# Patient Record
Sex: Male | Born: 1945 | Race: White | Hispanic: No | Marital: Married | Smoking: Former smoker
Health system: Southern US, Community
[De-identification: ages and names within clinical notes are randomized; demographics above are authoritative.]

## PROBLEM LIST (undated history)

## (undated) DIAGNOSIS — Z8719 Personal history of other diseases of the digestive system: Secondary | ICD-10-CM

## (undated) DIAGNOSIS — I10 Essential (primary) hypertension: Secondary | ICD-10-CM

## (undated) DIAGNOSIS — N289 Disorder of kidney and ureter, unspecified: Secondary | ICD-10-CM

## (undated) DIAGNOSIS — Z8711 Personal history of peptic ulcer disease: Secondary | ICD-10-CM

## (undated) HISTORY — PX: OTHER SURGICAL HISTORY: SHX169

---

## 1998-02-17 ENCOUNTER — Inpatient Hospital Stay (HOSPITAL_COMMUNITY): Admission: RE | Admit: 1998-02-17 | Discharge: 1998-02-22 | Payer: Self-pay | Admitting: *Deleted

## 1998-02-22 ENCOUNTER — Inpatient Hospital Stay (HOSPITAL_COMMUNITY)
Admission: RE | Admit: 1998-02-22 | Discharge: 1998-03-01 | Payer: Self-pay | Admitting: Physical Medicine and Rehabilitation

## 1998-03-21 ENCOUNTER — Encounter: Admission: RE | Admit: 1998-03-21 | Discharge: 1998-06-19 | Payer: Self-pay | Admitting: *Deleted

## 1998-08-25 ENCOUNTER — Ambulatory Visit (HOSPITAL_COMMUNITY): Admission: RE | Admit: 1998-08-25 | Discharge: 1998-08-25 | Payer: Self-pay | Admitting: *Deleted

## 1999-06-15 ENCOUNTER — Encounter: Payer: Self-pay | Admitting: Gastroenterology

## 1999-06-15 ENCOUNTER — Ambulatory Visit (HOSPITAL_COMMUNITY): Admission: RE | Admit: 1999-06-15 | Discharge: 1999-06-15 | Payer: Self-pay | Admitting: Gastroenterology

## 2000-08-19 ENCOUNTER — Encounter: Admission: RE | Admit: 2000-08-19 | Discharge: 2000-08-19 | Payer: Self-pay | Admitting: Neurology

## 2000-08-19 ENCOUNTER — Encounter: Payer: Self-pay | Admitting: Neurology

## 2000-10-28 ENCOUNTER — Ambulatory Visit (HOSPITAL_COMMUNITY): Admission: RE | Admit: 2000-10-28 | Discharge: 2000-10-28 | Payer: Self-pay | Admitting: Gastroenterology

## 2000-10-28 ENCOUNTER — Encounter (INDEPENDENT_AMBULATORY_CARE_PROVIDER_SITE_OTHER): Payer: Self-pay | Admitting: Specialist

## 2001-05-09 ENCOUNTER — Ambulatory Visit (HOSPITAL_BASED_OUTPATIENT_CLINIC_OR_DEPARTMENT_OTHER): Admission: RE | Admit: 2001-05-09 | Discharge: 2001-05-09 | Payer: Self-pay | Admitting: *Deleted

## 2002-07-30 ENCOUNTER — Ambulatory Visit (HOSPITAL_COMMUNITY): Admission: RE | Admit: 2002-07-30 | Discharge: 2002-07-30 | Payer: Self-pay | Admitting: Gastroenterology

## 2002-09-01 ENCOUNTER — Encounter: Admission: RE | Admit: 2002-09-01 | Discharge: 2002-09-01 | Payer: Self-pay | Admitting: General Surgery

## 2002-09-01 ENCOUNTER — Encounter: Payer: Self-pay | Admitting: General Surgery

## 2002-09-28 ENCOUNTER — Encounter: Payer: Self-pay | Admitting: Orthopedic Surgery

## 2002-10-01 ENCOUNTER — Inpatient Hospital Stay (HOSPITAL_COMMUNITY): Admission: RE | Admit: 2002-10-01 | Discharge: 2002-10-02 | Payer: Self-pay | Admitting: Orthopedic Surgery

## 2002-10-01 ENCOUNTER — Encounter: Payer: Self-pay | Admitting: Orthopedic Surgery

## 2005-12-13 ENCOUNTER — Encounter: Admission: RE | Admit: 2005-12-13 | Discharge: 2006-03-13 | Payer: Self-pay | Admitting: Orthopaedic Surgery

## 2007-03-15 ENCOUNTER — Emergency Department (HOSPITAL_COMMUNITY): Admission: EM | Admit: 2007-03-15 | Discharge: 2007-03-15 | Payer: Self-pay | Admitting: Emergency Medicine

## 2007-04-01 ENCOUNTER — Encounter: Admission: RE | Admit: 2007-04-01 | Discharge: 2007-04-01 | Payer: Self-pay | Admitting: Orthopedic Surgery

## 2007-11-06 ENCOUNTER — Inpatient Hospital Stay (HOSPITAL_COMMUNITY): Admission: RE | Admit: 2007-11-06 | Discharge: 2007-11-10 | Payer: Self-pay | Admitting: Orthopedic Surgery

## 2011-04-03 NOTE — Op Note (Signed)
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   PROCEDURES:  1. Removal of unicompartmental arthroplasty right knee.  2. Right total knee arthroplasty with DePuy components, #4 tibia, #4      femur, with a rotating platform, posterior stabilized at 10 mm poly      tray, with a 38 mm patella.   SURGEON:  Nadara Mustard, M.D.   ANESTHESIA:  General, plus femoral block.   ESTIMATED BLOOD LOSS:  Minimal.   ANTIBIOTICS:  2 g of Kefzol.   DRAINS:  None.   COMPLICATIONS:  None.   TOURNIQUET TIME:  300 mmHg at approximately to the thigh.   DISPOSITION:  To PACU in stable condition.   INDICATIONS FOR PROCEDURE:  The patient is a 65 year old gentleman who  is 5 years status post unicompartmental arthroplasty of the right knee.  The patient has developed progressive arthritis in the lateral joint  line as well as the patellofemoral joint and presents at this time for  surgical intervention.  Risks and benefits were discussed, including  infection, neurovascular injury, persistent pain, DVT, and need for  additional surgery.  The patient states he understands and wishes to  proceed at this time.   DESCRIPTION OF PROCEDURE:  The patient was brought to O.R. room #4 after  undergoing a femoral block.  He then underwent a general anesthetic.  After adequate levels of anesthesia were obtained, the patient's right  lower  extremity was prepped using DuraPrep and draped into a sterile  field.  An [name] was used cover all exposed skin.  A midline incision  was made, and this was incorporated into his medial parapatellar  incision from the unicompartmental arthroplasty.  This was carried down  with a medial retinacular incision.  The patella was everted.  The  unicompartmental arthroplasty was removed using an osteotome.  The  cement was also removed.  There was no damage or fracture through the  femoral condyle or the tibial plateau.  There was no loosening of the  implant.  The drill was then used to enter the femoral canal.  The IM  guide was used, set to 5 degrees of valgus, and this was set to take 11  mm off the distal femur.  The 11 mm cut was made, and this was sized for  a size 4. The cutting block for the size 4 was placed, and the chamfer  cuts were made for the size 4.  Attention was then focused on the tibia.  The  external alignment guide was used in the tibia, with neutral varus  and valgus alignment, with 0 degrees of posterior slope, and this was  pinned to take just additional bone just below the previous polyethylene  insert.  The tibial cut was made, and then this was sized for a size 4,  and the tray was placed, and the keel punches were made and the trial  component left in place.  Attention was then focused on the femur.  The  box-cutting tool was placed for the femur, and the box cuts were made.  A size 4 femur was placed with a size 4 tibia, and this was trialed with  tibial trays.  The #10 mm poly tray had full extension and was stable.  The trial components were removed.  Attention was focused on the  patella, and 10 mm was taken off the patella.  This was sized for a 38,  and the punch holes were made for the size 38, and the punch holes were  also made for the femoral component keels.  The knee was irrigated with  pulse lavage.  The posterior aspect of the joint was cleansed.  The   posterior capsule was injected with a total of 60 mL of 0.5% Marcaine  plain.  After irrigation with pulse lavage, the tibial and femoral  components were cemented in place.  The tibial tray was placed, and the  knee was kept in extension until the cement had hardened.  The patellar  component was inserted, and the clamp was left in place until the cement  had hardened.  All loose cement was removed.  After the cement had  hardened, the knee was placed through a full range of motion.  There was  good midline tracking of the patella.  The retinacular incision was  closed using #1 Vicryl.  The subcutaneous was closed using 2-0 Vicryl.  The skin was closed using approximating staples.  The wound was covered  with a Mepilex dressing and an ice pack.  The patient was extubated and  taken to the PACU in stable condition.      Nadara Mustard, MD  Electronically Signed     MVD/MEDQ  D:  11/06/2007  T:  11/07/2007  Job:  [id number]

## 2011-04-06 NOTE — Discharge Summary (Signed)
Arthur Cook, Arthur Cook NO.:  [id number]   MEDICAL RECORD NO.:  [id number]          PATIENT TYPE:  INP   LOCATION:  5037                         FACILITY:  [facility]   PHYSICIAN:  Nadara Mustard, MD     DATE OF BIRTH:  Jul 18, 1946   DATE OF ADMISSION:  11/06/2007  DATE OF DISCHARGE:  11/10/2007                               DISCHARGE SUMMARY   FINAL DIAGNOSIS:  Osteoarthritis right knee, status post  unicompartmental arthroplasty.   PROCEDURES:  1. Removal of unicompartmental arthroplasty, right knee.  2. Right total knee arthroplasty with DePuy #4 tibia, #4 femur, 10 mm      poly tray and 38 mm patella.   The patient was discharged to home in stable condition with home health  physical therapy.  Prescriptions for Vicodin, Tylox and Coumadin.  Plan  to follow up in office two weeks postoperatively.   HISTORY OF PRESENT ILLNESS:  Patient is 65 year old gentleman who is  status post unicompartmental arthroplasty.  The patient has developed  progressive arthritis in the other two compartments in his knee.  His  previous arthroplasty was stable, however, due to the progressive  arthritis and pain with activities of daily living, the patient wished  to proceed with revision to a total knee arthroplasty at this time.   HOSPITAL COURSE:  The patient's hospital course essentially  unremarkable.  He underwent a revision total knee arthroplasty on  November 06, 2007.  He received general anesthesia femoral and block,  Kefzol for infection prophylaxis.  Postoperatively, the patient received  Kefzol for infection prophylaxis for 24 hours and received Coumadin for  DVT prophylaxis.  The patient progressed well with physical therapy and  was felt to be safe for discharge to home and the patient was discharged  to home in stable condition on November 10, 2007, with follow-up in the  office in two weeks.      Nadara Mustard, MD  Electronically Signed     MVD/MEDQ  D:   12/25/2007  T:  12/26/2007  Job:  [id number]

## 2011-04-06 NOTE — Op Note (Signed)
NAME:  GEVORK, AYYAD                           ACCOUNT NO.:  [id number]   MEDICAL RECORD NO.:  [id number]                   PATIENT TYPE:  INP   LOCATION:  2873                                 FACILITY:  [facility]   PHYSICIAN:  Nadara Mustard, M.D.                DATE OF BIRTH:  1946/08/19   DATE OF PROCEDURE:  10/01/2002  DATE OF DISCHARGE:                                 OPERATIVE REPORT   PREOPERATIVE DIAGNOSIS:  Osteoarthritis right knee, medial joint line.   POSTOPERATIVE DIAGNOSIS:  Osteoarthritis right knee, medial joint line.   OPERATION PERFORMED:  1. Right knee unicompartmental arthroplasty with Osteonics components #10     tibial tray medium and a medium femoral component.  2. Partial patellectomy.   SURGEON:  Nadara Mustard, M.D.   ANESTHESIA:  General.   ESTIMATED BLOOD LOSS:  Minimal.   ANTIBIOTICS:  1 gm Kefzol.   TOURNIQUET TIME:  60 minutes at 350 mmHg.   DISPOSITION:  To PACU in stable condition.   INDICATIONS FOR PROCEDURE:  The patient is a 65 year old gentleman with  unicompartmental osteoarthritis of his right knee.  The patient has failed  conservative care  and presents at this time for unicompartmental  arthroplasty.  The risks and benefits were discussed including infection,  neurovascular injury, persistent pain and need for revision to a total knee.  The patient states he understands and wishes to proceed at this time.   DESCRIPTION OF PROCEDURE:  The patient was brought to operating room 4 and  underwent general anesthetic.  After an adequate level of anesthesia was  obtained, the patient's right lower extremity was prepped using DuraPrep and  draped into a sterile field.  [name] was used to cover all exposed skin and a  curtain drape was placed between the patient and the surgeon to maintain a  sterile field.  The leg was elevated and tourniquet inflated to 350 mmHg.  A  medial parapatellar incision was made and this was carried from the  superior  pole of the patella to the tibial tubercle.  This was carried down through  the retinaculum.  The fat pad was excised.  The retractors were placed and a  partial patellectomy was first performed off the medial patella.  The  external alignment guide was then placed for the tibia and this was set and  approximately 6 mm of tibia was removed.  This sized to be a medium in size.  Attention was focused on the femur.  Before the femur, the spacer blocks  were placed and the patient had equal flexion and extension gaps.  The  femoral block was placed.  This sized to be a medium with the external  alignment guide heading towards the femoral head.  This was pinned.  The  posterior femoral Chamfer was then made.  The Keel cut was made.  The router  guide was  then placed and the router cut was made for the femoral component.  Saline was used for irrigation during this procedure.  The cutting block was  removed and the remainder of the bone was removed.  The femoral and tibial  components were placed.  A tongue depressor blade could just be fit with the  knee in flexion and extension.  The patient had balanced flexion and  extension gaps.  The cement was mixed and the tibial and then femoral  component were then cemented in place.  This was left with the knee in about  30 degrees of flexion and this was maintained in stable condition until the  cement hardened.  The knee cement was removed.  The knee was placed through  a full range of motion.  The patient had good stable sensation and good  tracking.  The retinaculum was closed using a #1 Vicryl.  The subcu was  closed using 2-0 Vicryl and the skin was closed using Proximate staples.  The wound was covered with Adaptic orthopedic sponges and a Coban from the  foot to the thigh.  The patient was extubated and taken to PACU in stable  condition.  Total tourniquet time 60 minutes.                                                 Nadara Mustard, M.D.    MVD/MEDQ  D:  10/01/2002  T:  10/01/2002  Job:  [id number]

## 2011-04-06 NOTE — Procedures (Signed)
[place]. [facility]  Patient:    Arthur Cook, Arthur Cook                        MRN: [id number] Proc. Date: 10/28/00 Adm. Date:  [id number] Attending:  Nelda Marseille CC:         Pearla Dubonnet, M.D.   Procedure Report  PROCEDURE:  Esophagogastroduodenoscopy with biopsy balloon dilatation of the pylorus.  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  Consent was signed after risks, benefits, methods, and options thoroughly discussed on multiple occasions.  MEDICATIONS:  Demerol 100 mg, Versed 10 mg.  DESCRIPTION OF PROCEDURE:  The pediatric video endoscope was inserted by direct vision.  The esophagus was normal except for a small hiatal hernia. The scope was inserted into the stomach, and some old food was seen.  The stomach seemed to be dilated.  We advanced to the pylorus after realizing none of the food could be suctioned.  The pylorus looked unchanged with its proximal and distal fibrous strictures.  No active ulcers were seen.  The scope was able to be passed through this strictured area without much difficulty and into the distal duodenal bulb and around the C-loop to a normal second portion of the duodenum.  No active ulcers were seen.  The scope was then slowly withdrawn back to the stomach and retroflexed.  The cardia and fundus were normal.  The angularis was normal.  The food obscured part of the greater curve.  The scope was then reinserted into the second portion of the duodenum, and the 10 mm balloon, 5.5 in length, was carefully advanced into the duodenum, and the scope and the balloon carefully withdrawn to the proper position in the stomach.  This balloon was inflated but did not seem to do any dilations at maximum strength, so it was removed and the above-mentioned technique was used on both the 12 mm and then the 14 mm balloon.  He did seem to have some pain with blowing up both of these balloons,  and there was some minimal heme with each balloon.  After each balloon was held up for 45-60 seconds, the area was washed and watched.  There was no active bleeding.  The scope was withdrawn back to the stomach.  Two biopsies of the antrum and two in the fundus were obtained.  The residual water was suctioned.  Air was suctioned, and the scope was slowly withdrawn.  The patient tolerated the procedure well.  There was no obvious immediate complication.  ENDOSCOPIC DIAGNOSES: 1. Small hiatal hernia. 2. Old food in the dilated stomach. 3. Pyloric scarring essentially unchanged from previous EGDs. 4. Otherwise within normal limits EGD, status post gastric biopsy, rule out    Helicobacter therapy, balloon dilatation 10, 12, and 14 mm balloons, 5.5 in    length, with minimal pain and heme with the last two balloons.  PLAN:  Clear liquids only for 12 hours.  Will re-discuss surgical options. Avoid aspirin and nonsteroidals.  Continue pump inhibitors every day.  Call p.r.n., and otherwise follow up in one month to discuss any further workup plans or surgical options. DD:  10/28/00 TD:  10/28/00 Job: [id number]

## 2011-04-06 NOTE — Op Note (Signed)
TNAMESHAUGHN, THOMLEY                          ACCOUNT NO.:  [id number]   MEDICAL RECORD NO.:  [id number]                   PATIENT TYPE:  AMB   LOCATION:  ENDO                                 [facility]ACILITY[facility]  [facility]   PHYSICIAN:  Petra Kuba, M.D.                 DATE OF BIRTH:  Jun 20, 1946   DATE OF PROCEDURE:  DATE OF DISCHARGE:                                 OPERATIVE REPORT   PROCEDURE:  Esophagogastroduodenoscopy with pylorus balloon dilatation.   Consent was signed after risks, benefits, methods, and options were  thoroughly discussed once again with me prior to any sedation given and  multiple times in the past and by my nurse on the phone.   MEDICINES USED:   DESCRIPTION OF PROCEDURE:  The 160 video endoscope was inserted by direct  vision, the esophagus was normal. In the distal esophagus was a tiny hiatal  hernia. The scope passed into the stomach and lots of old food was seen,  some of which could be washed and suctioned but there were some free  particles which could not. The scope was then advanced through the normal  antrum to the pylorus which seemed to be scarred with linear ulcer around  it. We could not advance the scope through there. There seemed to be a tiny  opening engaged on multiple balloon dilatations in the past. We went ahead  and tried to as carefully as we could advance the 10 mm 5.5 cm balloon  through the pylorus and into the duodenum. We went ahead and inflated the 10  mm balloon and held it there fore 45 seconds. There was some heme but no  obvious other complication. We could not advance the scope through the  opening but the opening was bigger so we went ahead and removed then and  advanced the 12 mm, 5.5 cm balloon and after this was inflated and held for  45 seconds in seemingly the proper position, there was slightly more heme.  We were able to advance the scope through the opening. There was some  minimal bleeding, no obvious signs of  perforation. The C loop and the second  portion of the duodenum were normal. 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 There was no ulcer except for on the rim  of the pylorus on the stomach side which was thin and superficial. We went  ahead and advanced the 14 mm, 8.5 cm balloon. We inflated that for one  minute. After that was inflated, we again reinverted the scope. There was no  significant active bleeding, the area was washed and watched. The scope was  slowly withdrawn, some of the water was suctioned. On retroflexion only some  of the proximal stomach could be seen. The angularis was normal but the food  that was unable to be suctioned obscured the majority of the greater  curve  and proximal stomach. Air was suctioned, scope slowly withdrawn. Again a  good look at the esophagus was normal, scope removed. The patient tolerated  the procedure well. There was no obvious or immediate complication.   ENDOSCOPIC DIAGNOSIS:  1. Tiny hiatal hernia.  2. Old food in the proximal stomach.  3. Pyloric stenosis with a linear ulcer, unable to able to advance the 160     scope initially.  4. Status post dilatation using pyloric balloons to 14 mm. Able to advance     the scope after 12 mm dilation with only minimal heme and no obvious     complication.  5. C loop and second portion of the duodenum okay.   PLAN:  Clear liquids for 12 hours and rediscuss surgical options afterwards  and still may want to consider a surgical pyloroplasty versus Billroth  procedure. Would try to decrease Vioxx as I have warned him in the past and  increased  his pump inhibitors. He will call me if he has problems with the insurance.  Be happy to redilate p.r.n. and otherwise rule out in two months but call me  sooner p.r.n. particularly in a complication delay or in two to three weeks  if this had not been helpful.                                               Petra Kuba,  M.D.    MEM/MEDQ  D:  07/30/2002  T:  07/31/2002  Job:  [id number]   cc:   Barbette Or, M.D.

## 2011-04-06 NOTE — Op Note (Signed)
[place]. [facility]  Patient:    Arthur Cook, Arthur Cook                        MRN: [id number] Adm. Date:  [id number] Attending:  Maryanna Shape                           Operative Report  PREOPERATIVE DIAGNOSIS:  Right knee loose bodies and early osteoarthritis.  POSTOPERATIVE DIAGNOSES:  Posterior horn tear of medial meniscus; grade 4 chondromalacia, multiple chondral fragments; undersurface tear, lateral meniscus.  OPERATIVE PROCEDURE:  Arthroscopic exam, partial medial meniscectomy, debridement of multiple shed chondral fragments.  SURGEON:  Reynolds Bowl, M.D.  ANESTHESIA:  Local with standby.  DESCRIPTION OF PROCEDURE:  Patient was given local anesthetic and sedation. The right lower extremity was prepped and draped in the usual manner.  I established anteromedial and anterolateral portals and I examined the knee. In suprapatellar pouch area, the synovium appeared throughout to be irritated. There were multiple small chondral fragments that had been shed.  There were grade 4 changes of the medial femoral condyle and posteriorly, approximately half of the medial meniscus was degenerate and shredded and flapped.  The anterior cruciate was intact.  The notch was narrowing.  The lateral joint line had maybe grade 2 changes and there was an undersurface tear of the lateral meniscus that did not go all the way through the top and did not displace.  I used the arthroscope and small forceps to remove the posterior horn of the medial meniscus back to stable tissue, then I spent considerable time with the rotatory meniscotome and suction, drawing up multiple loose bodies throughout the joint and in the end, I just spent time using some of the remainder of the fluid.  The fluid flowed through the knee and I was pulsating the knee to remove any other unseen fragments.  Having done that, the instruments were removed, 15 cc of Marcaine with epinephrine injected in  the knee, portals approximated with 5-0 nylon, a bulky dressing applied.  INSTRUCTIONS TO PATIENT:  Take Vicodin for pain.  Do frequent quad sets and ankle pumps.  Use cold pack as tolerated.  Walk with a fairly straight knee for a few days to prevent swelling, full weightbear.  Come to the office as planned.  Call sooner with concerns. DD:  05/09/01 TD:  05/09/01 Job: [id number]

## 2011-08-24 LAB — BASIC METABOLIC PANEL
BUN: 10
Calcium: 8.7
Chloride: 105
Creatinine, Ser: 1.14
Creatinine, Ser: 1.15
GFR calc Af Amer: >60
GFR calc Af Amer: >60
GFR calc Af Amer: >60
GFR calc non Af Amer: 54 — ABNORMAL LOW
GFR calc non Af Amer: >60
Potassium: 4.2
Potassium: 4.2
Sodium: 136
Sodium: 137

## 2011-08-24 LAB — PROTIME-INR
INR: 1
INR: 1.5
INR: 1.9 — ABNORMAL HIGH
INR: 1.9 — ABNORMAL HIGH
Prothrombin Time: 13.3
Prothrombin Time: 22.6 — ABNORMAL HIGH

## 2011-08-24 LAB — CBC
Hemoglobin: 10.1 — ABNORMAL LOW
Hemoglobin: 11.4 — ABNORMAL LOW
MCV: 86.8
Platelets: 289
RBC: 3.42 — ABNORMAL LOW
RBC: 3.86 — ABNORMAL LOW
RBC: 4.41
RDW: 12.4
WBC: 11.8 — ABNORMAL HIGH
WBC: 12.2 — ABNORMAL HIGH
WBC: 14.3 — ABNORMAL HIGH

## 2011-08-27 LAB — BASIC METABOLIC PANEL
Calcium: 9.3
Chloride: 108
Creatinine, Ser: 0.89
GFR calc Af Amer: >60

## 2011-08-27 LAB — CBC
RBC: 4.98
WBC: 8.6

## 2011-08-27 LAB — PROTIME-INR
INR: 0.9
Prothrombin Time: 12.6

## 2017-10-26 ENCOUNTER — Inpatient Hospital Stay (HOSPITAL_COMMUNITY)
Admission: EM | Admit: 2017-10-26 | Discharge: 2017-10-30 | DRG: 381 | Disposition: A | Discharge status: Home / Self Care | Payer: Non-veteran care | Attending: Family Medicine | Admitting: Family Medicine

## 2017-10-26 ENCOUNTER — Emergency Department (HOSPITAL_COMMUNITY): Payer: Non-veteran care

## 2017-10-26 ENCOUNTER — Encounter (HOSPITAL_COMMUNITY): Payer: Self-pay | Admitting: Emergency Medicine

## 2017-10-26 DIAGNOSIS — K2980 Duodenitis without bleeding: Secondary | ICD-10-CM | POA: Diagnosis present

## 2017-10-26 DIAGNOSIS — R911 Solitary pulmonary nodule: Secondary | ICD-10-CM | POA: Diagnosis present

## 2017-10-26 DIAGNOSIS — K920 Hematemesis: Secondary | ICD-10-CM | POA: Diagnosis present

## 2017-10-26 DIAGNOSIS — I129 Hypertensive chronic kidney disease with stage 1 through stage 4 chronic kidney disease, or unspecified chronic kidney disease: Secondary | ICD-10-CM | POA: Diagnosis present

## 2017-10-26 DIAGNOSIS — Z8719 Personal history of other diseases of the digestive system: Secondary | ICD-10-CM

## 2017-10-26 DIAGNOSIS — Z79899 Other long term (current) drug therapy: Secondary | ICD-10-CM

## 2017-10-26 DIAGNOSIS — N2889 Other specified disorders of kidney and ureter: Secondary | ICD-10-CM | POA: Diagnosis present

## 2017-10-26 DIAGNOSIS — Z0189 Encounter for other specified special examinations: Secondary | ICD-10-CM

## 2017-10-26 DIAGNOSIS — K2210 Ulcer of esophagus without bleeding: Secondary | ICD-10-CM | POA: Diagnosis present

## 2017-10-26 DIAGNOSIS — Z978 Presence of other specified devices: Secondary | ICD-10-CM

## 2017-10-26 DIAGNOSIS — R319 Hematuria, unspecified: Secondary | ICD-10-CM | POA: Diagnosis not present

## 2017-10-26 DIAGNOSIS — I10 Essential (primary) hypertension: Secondary | ICD-10-CM | POA: Diagnosis present

## 2017-10-26 DIAGNOSIS — Z8546 Personal history of malignant neoplasm of prostate: Secondary | ICD-10-CM

## 2017-10-26 DIAGNOSIS — Z8711 Personal history of peptic ulcer disease: Secondary | ICD-10-CM

## 2017-10-26 DIAGNOSIS — Z87891 Personal history of nicotine dependence: Secondary | ICD-10-CM

## 2017-10-26 DIAGNOSIS — G43909 Migraine, unspecified, not intractable, without status migrainosus: Secondary | ICD-10-CM | POA: Diagnosis present

## 2017-10-26 DIAGNOSIS — N289 Disorder of kidney and ureter, unspecified: Secondary | ICD-10-CM

## 2017-10-26 DIAGNOSIS — Z791 Long term (current) use of non-steroidal anti-inflammatories (NSAID): Secondary | ICD-10-CM

## 2017-10-26 DIAGNOSIS — K311 Adult hypertrophic pyloric stenosis: Secondary | ICD-10-CM | POA: Diagnosis not present

## 2017-10-26 DIAGNOSIS — N181 Chronic kidney disease, stage 1: Secondary | ICD-10-CM | POA: Diagnosis present

## 2017-10-26 DIAGNOSIS — G4700 Insomnia, unspecified: Secondary | ICD-10-CM | POA: Diagnosis present

## 2017-10-26 DIAGNOSIS — R112 Nausea with vomiting, unspecified: Secondary | ICD-10-CM | POA: Diagnosis present

## 2017-10-26 HISTORY — DX: Personal history of peptic ulcer disease: Z87.11

## 2017-10-26 HISTORY — DX: Essential (primary) hypertension: I10

## 2017-10-26 HISTORY — DX: Disorder of kidney and ureter, unspecified: N28.9

## 2017-10-26 HISTORY — DX: Personal history of other diseases of the digestive system: Z87.19

## 2017-10-26 LAB — HEPATIC FUNCTION PANEL
ALT: 25 U/L (ref 17–63)
AST: 23 U/L (ref 15–41)
Albumin: 4.6 g/dL (ref 3.5–5.0)
Alkaline Phosphatase: 52 U/L (ref 38–126)
Total Bilirubin: 0.7 mg/dL (ref 0.3–1.2)
Total Protein: 8.1 g/dL (ref 6.5–8.1)

## 2017-10-26 LAB — CBC
HEMATOCRIT: 46.6 % (ref 39.0–52.0)
HEMOGLOBIN: 15.5 g/dL (ref 13.0–17.0)
MCH: 28.9 pg (ref 26.0–34.0)
MCHC: 33.3 g/dL (ref 30.0–36.0)
MCV: 86.8 fL (ref 78.0–100.0)
Platelets: 338 10*3/uL (ref 150–400)
RBC: 5.37 MIL/uL (ref 4.22–5.81)
RDW: 13.2 % (ref 11.5–15.5)
WBC: 15.4 10*3/uL — AB (ref 4.0–10.5)

## 2017-10-26 LAB — BASIC METABOLIC PANEL
ANION GAP: 11 (ref 5–15)
BUN: 17 mg/dL (ref 6–20)
CALCIUM: 10.8 mg/dL — AB (ref 8.9–10.3)
CO2: 32 mmol/L (ref 22–32)
Chloride: 98 mmol/L — ABNORMAL LOW (ref 101–111)
Creatinine, Ser: 1.33 mg/dL — ABNORMAL HIGH (ref 0.61–1.24)
GFR, EST AFRICAN AMERICAN: 60 mL/min — AB (ref >60)
GFR, EST NON AFRICAN AMERICAN: 51 mL/min — AB (ref >60)
Glucose, Bld: 146 mg/dL — ABNORMAL HIGH (ref 65–99)
POTASSIUM: 3.7 mmol/L (ref 3.5–5.1)
SODIUM: 141 mmol/L (ref 135–145)

## 2017-10-26 LAB — I-STAT TROPONIN, ED: TROPONIN I, POC: 0 ng/mL (ref 0.00–0.08)

## 2017-10-26 LAB — LIPASE, BLOOD: LIPASE: 48 U/L (ref 11–51)

## 2017-10-26 MED ORDER — LORAZEPAM 2 MG/ML IJ SOLN
0.5000 mg | Freq: Once | INTRAMUSCULAR | Status: AC
  Administered 2017-10-26: 0.5 mg via INTRAVENOUS
  Filled 2017-10-26: qty 1

## 2017-10-26 MED ORDER — SUCRALFATE 1 G PO TABS
1.0000 g | ORAL_TABLET | Freq: Once | ORAL | Status: DC
  Filled 2017-10-26: qty 1

## 2017-10-26 MED ORDER — GI COCKTAIL ~~LOC~~
30.0000 mL | Freq: Once | ORAL | Status: AC
  Administered 2017-10-26: 30 mL via ORAL
  Filled 2017-10-26: qty 30

## 2017-10-26 MED ORDER — DIPHENHYDRAMINE HCL 50 MG/ML IJ SOLN
12.5000 mg | Freq: Once | INTRAMUSCULAR | Status: AC
  Administered 2017-10-26: 12.5 mg via INTRAVENOUS
  Filled 2017-10-26: qty 1

## 2017-10-26 MED ORDER — SODIUM CHLORIDE 0.9 % IV SOLN
INTRAVENOUS | Status: DC
  Administered 2017-10-26 (×2): via INTRAVENOUS

## 2017-10-26 MED ORDER — IOPAMIDOL (ISOVUE-300) INJECTION 61%
INTRAVENOUS | Status: AC
  Administered 2017-10-26: 100 mL
  Filled 2017-10-26: qty 100

## 2017-10-26 MED ORDER — ONDANSETRON HCL 4 MG/2ML IJ SOLN
4.0000 mg | Freq: Once | INTRAMUSCULAR | Status: AC
  Administered 2017-10-26: 4 mg via INTRAVENOUS
  Filled 2017-10-26: qty 2

## 2017-10-26 MED ORDER — MORPHINE SULFATE (PF) 4 MG/ML IV SOLN
4.0000 mg | Freq: Once | INTRAVENOUS | Status: AC
  Administered 2017-10-26: 4 mg via INTRAVENOUS
  Filled 2017-10-26: qty 1

## 2017-10-26 MED ORDER — METOCLOPRAMIDE HCL 5 MG/ML IJ SOLN
5.0000 mg | Freq: Once | INTRAMUSCULAR | Status: AC
  Administered 2017-10-26: 5 mg via INTRAVENOUS
  Filled 2017-10-26: qty 2

## 2017-10-26 MED ORDER — SODIUM CHLORIDE 0.9 % IV BOLUS (SEPSIS)
1000.0000 mL | Freq: Once | INTRAVENOUS | Status: AC
  Administered 2017-10-26: 1000 mL via INTRAVENOUS

## 2017-10-26 MED ORDER — PANTOPRAZOLE SODIUM 40 MG IV SOLR
40.0000 mg | Freq: Once | INTRAVENOUS | Status: AC
  Administered 2017-10-26: 40 mg via INTRAVENOUS
  Filled 2017-10-26: qty 40

## 2017-10-26 NOTE — Consult Note (Signed)
Reason for Consult:gastric outlet obstruction Referring Physician: dr Arthur Cook is an 71 y.o. male.  HPI: 71 year old gentleman with known history of peptic ulcer disease comes to the emergency room because of persistent upper abdominal pain and vomiting since yesterday.  His past medical history includes hypertension, BPH, prostate cancer.  He gets the majority of his care at the [facility].  He states that in the past Dr. Watt Cook has done upper endoscopy and dilated his stomach.  The last upper endoscopy was many years ago.  He has chronic migraines.  He states that the [facility] will only give him 10 injections for migraines per month.  When he has run out his only other option is to take NSAIDs such as BC powders for his headaches.  He states that he suffers about 25 migraines per month.  He takes reflux medication as needed.  He reports chronic abdominal bloating but denies early satiety.  He denies weight loss.  His last bowel movement was today.  He denies fevers or chills.  He denies hematemesis.  He denies prior abdominal surgery.  He states that he had bloody clots in his urine last week.  They state that he has a history of prostate cancer but never received treatment it was just monitored.  He reports that he urinates multiple times per night.  They are aware of a right renal mass but was told it was benign.  All his imaging was done at the [facility] in [place]  He used to smoke but stopped smoking several years ago.  Past Medical History:  Diagnosis Date  . History of stomach ulcers   . Hypertension   . Renal disorder    frequent UTIs    Past Surgical History:  Procedure Laterality Date  . knee operations      No family history on file.  Social History:  reports that he has quit smoking. His smoking use included cigarettes. he has never used smokeless tobacco. He reports that he does not drink alcohol or use drugs.  Allergies: No Known Allergies  Medications: I have reviewed the  patient's current medications.  Results for orders placed or performed during the hospital encounter of 10/26/17 (from the past 48 hour(s))  Basic metabolic panel     Status: Abnormal   Collection Time: 10/26/17  2:50 PM  Result Value Ref Range   Sodium 141 135 - 145 mmol/L   Potassium 3.7 3.5 - 5.1 mmol/L   Chloride 98 (L) 101 - 111 mmol/L   CO2 32 22 - 32 mmol/L   Glucose, Bld 146 (H) 65 - 99 mg/dL   BUN 17 6 - 20 mg/dL   Creatinine, Ser 1.33 (H) 0.61 - 1.24 mg/dL   Calcium 10.8 (H) 8.9 - 10.3 mg/dL   GFR calc non Af Amer 51 (L) >60 mL/min   GFR calc Af Amer 60 (L) >60 mL/min    Comment: (NOTE) The eGFR has been calculated using the CKD EPI equation. This calculation has not been validated in all clinical situations. eGFR's persistently <60 mL/min signify possible Chronic Kidney Disease.    Anion gap 11 5 - 15  CBC     Status: Abnormal   Collection Time: 10/26/17  2:50 PM  Result Value Ref Range   WBC 15.4 (H) 4.0 - 10.5 K/uL   RBC 5.37 4.22 - 5.81 MIL/uL   Hemoglobin 15.5 13.0 - 17.0 g/dL   HCT 46.6 39.0 - 52.0 %   MCV 86.8 78.0 -  100.0 fL   MCH 28.9 26.0 - 34.0 pg   MCHC 33.3 30.0 - 36.0 g/dL   RDW 13.2 11.5 - 15.5 %   Platelets 338 150 - 400 K/uL  I-stat troponin, ED     Status: None   Collection Time: 10/26/17  3:11 PM  Result Value Ref Range   Troponin i, poc 0.00 0.00 - 0.08 ng/mL   Comment 3            Comment: Due to the release kinetics of cTnI, a negative result within the first hours of the onset of symptoms does not rule out myocardial infarction with certainty. If myocardial infarction is still suspected, repeat the test at appropriate intervals.   Lipase, blood     Status: None   Collection Time: 10/26/17  4:43 PM  Result Value Ref Range   Lipase 48 11 - 51 U/L  Hepatic function panel     Status: Abnormal   Collection Time: 10/26/17  4:43 PM  Result Value Ref Range   Total Protein 8.1 6.5 - 8.1 g/dL   Albumin 4.6 3.5 - 5.0 g/dL   AST 23 15 - 41  U/L   ALT 25 17 - 63 U/L   Alkaline Phosphatase 52 38 - 126 U/L   Total Bilirubin 0.7 0.3 - 1.2 mg/dL   Bilirubin, Direct <0.1 (L) 0.1 - 0.5 mg/dL   Indirect Bilirubin NOT CALCULATED 0.3 - 0.9 mg/dL    Dg Chest 2 View  Result Date: 10/26/2017 CLINICAL DATA:  Chest pain. EXAM: CHEST  2 VIEW COMPARISON:  Two-view chest x-ray 11/06/2007 FINDINGS: The heart size and mediastinal contours are within normal limits. Both lungs are clear. The visualized skeletal structures are unremarkable. IMPRESSION: Negative two view chest x-ray Electronically Signed   By: San Morelle M.D.   On: 10/26/2017 15:22   Ct Abdomen Pelvis W Contrast  Result Date: 10/26/2017 CLINICAL DATA:  Dark colored vomiting.  Hematuria 2 weeks ago. EXAM: CT ABDOMEN AND PELVIS WITH CONTRAST TECHNIQUE: Multidetector CT imaging of the abdomen and pelvis was performed using the standard protocol following bolus administration of intravenous contrast. CONTRAST:  125m ISOVUE-300 IOPAMIDOL (ISOVUE-300) INJECTION 61% COMPARISON:  None. FINDINGS: Lower chest: There is a nodule in the right lung on series 4, image 2 adjacent to the fissure measuring 6.7 mm. No other acute abnormalities are identified in the lung bases. Hepatobiliary: Hepatic steatosis. The liver, portal vein, and gallbladder are otherwise normal. Pancreas: Focal rounded fat in the pancreas on series 3, image 47 of no significance. The pancreas is otherwise normal. Spleen: Normal in size without focal abnormality. Adrenals/Urinary Tract: There is a 12 mm nodule in the left adrenal gland on series 3, image 30, too small to characterize. The right adrenal gland is normal. There is a 3.5 cm mass off the upper pole the right kidney with an attenuation of 24 Hounsfield units. No other renal masses. No hydronephrosis or perinephric stranding. No ureterectasis or ureteral stone. The bladder is normal. Stomach/Bowel: The stomach is markedly distended with fluid and debris. The proximal  duodenum is normal in caliber. The small bowel is otherwise normal. Colonic diverticulosis is seen without focal diverticulitis. The visualized appendix is normal. Vascular/Lymphatic: Atherosclerotic changes are seen in the nonaneurysmal aorta, iliac vessels common femoral vessels. No adenopathy. Reproductive: Prostate calcifications are identified. The prostate is otherwise normal. Other: There is mild increased attenuation in the fat of the gastrohepatic ligament along the lesser curvature of the stomach. No free air  or free fluid. Musculoskeletal: No acute or significant osseous findings. IMPRESSION: 1. There is marked distension of the stomach. There may be mild thickening in the region of the distal antrum and pylorus based on coronal images. The findings are most consistent with gastric outlet obstruction of unknown etiology. The mild increased attenuation in the fat of the gastrohepatic ligament could be due to inflammation or venous congestion given the gastric distention. 2. There is a mass in the upper pole the right kidney which is not definitely cystic based on today's study. I suspect this is a complicated cyst but a solid mass is not excluded. Recommend ultrasound for further evaluation. If the mass is not definitively cystic on ultrasound, recommend MRI. 3. 6.7 mm nodule in the right lung base. Non-contrast chest CT at 6-12 months is recommended. If the nodule is stable at time of repeat CT, then future CT at 18-24 months (from today's scan) is considered optional for low-risk patients, but is recommended for high-risk patients. This recommendation follows the consensus statement: Guidelines for Management of Incidental Pulmonary Nodules Detected on CT Images: From the Fleischner Society 2017; Radiology 2017; 284:228-243. 4. 12 mm nodule in the left adrenal gland is too small to characterize. If the patient does not have a known malignancy, recommend a 12 month follow-up CT scan to ensure stability.  5. Atherosclerotic change in the abdominal aorta. Electronically Signed   By: Dorise Bullion III M.D   On: 10/26/2017 21:45    Review of Systems  Constitutional: Negative for chills, fever and weight loss.  HENT: Negative for nosebleeds.   Eyes: Negative for blurred vision.  Respiratory: Negative for shortness of breath.   Cardiovascular: Negative for chest pain, palpitations, orthopnea and PND.       Denies DOE  Gastrointestinal: Positive for abdominal pain, nausea and vomiting. Negative for blood in stool and constipation.  Genitourinary: Positive for hematuria. Negative for dysuria.  Musculoskeletal: Negative.   Skin: Negative for itching and rash.  Neurological: Negative for dizziness, focal weakness, seizures, loss of consciousness and headaches.       Denies TIAs, amaurosis fugax  Endo/Heme/Allergies: Does not bruise/bleed easily.  Psychiatric/Behavioral: The patient is not nervous/anxious.    Blood pressure (!) 202/120, pulse (!) 111, temperature 98 F (36.7 C), temperature source Oral, resp. rate (!) 27, height '5\' 10"'$  (1.778 m), weight 108.9 kg (240 lb), SpO2 91 %. Physical Exam  Vitals reviewed. Constitutional: He is oriented to person, place, and time. He appears well-developed and well-nourished. No distress.  HENT:  Head: Normocephalic and atraumatic.  Right Ear: External ear normal.  Left Ear: External ear normal.  Eyes: Conjunctivae are normal. No scleral icterus.  Neck: Normal range of motion. Neck supple. No tracheal deviation present. No thyromegaly present.  Cardiovascular: Normal rate and normal heart sounds.  Respiratory: Effort normal and breath sounds normal. No stridor. No respiratory distress. He has no wheezes.  GI: Soft. There is no tenderness. There is no rebound and no guarding.  Mild distension  Musculoskeletal: He exhibits no edema or tenderness.  Lymphadenopathy:    He has no cervical adenopathy.  Neurological: He is alert and oriented to person,  place, and time. He exhibits normal muscle tone.  Skin: Skin is warm and dry. No rash noted. He is not diaphoretic. No erythema. No pallor.  Psychiatric: He has a normal mood and affect. His behavior is normal. Judgment and thought content normal.    Assessment/Plan: Gastric outlet obstruction Migraines Hypertension BPH Recent hematuria  Chronic NSAID use Right renal mass Right lung nodule  On placement of the nasogastric tube over 2 L of gastric contents was evacuated.  He felt better. Admit to medicine service given multiple comorbidities Needs [facility] gastroenterology consult for upper endoscopy Continue bowel rest, NG tube decompression More than likely his gastric outlet obstruction is due to peptic ulcer disease but need to rule out malignancy Start IV Protonix  With respect to his right renal mass, it sounds like this is a known issue but nonetheless would recommend ultrasound  Defer hematuria workup to primary team may need to contact his [facility] team to get his records  Surgical plan (if any) depends on results of upper endoscopy  Discussed treatment plan with patient and his family.  Tried to answer as many questions as I could  Arthur Ruff. Redmond Pulling, MD, FACS General, Bariatric, & Minimally Invasive Surgery [facility]   Greer Pickerel 10/26/2017, 11:17 PM

## 2017-10-26 NOTE — ED Notes (Signed)
EDP at bedside  

## 2017-10-26 NOTE — ED Notes (Signed)
Surgery MD at bedside.

## 2017-10-26 NOTE — ED Notes (Signed)
Awaiting CT at this time. Called CT and pt is next in line.

## 2017-10-26 NOTE — ED Notes (Signed)
Patient transported to CT 

## 2017-10-26 NOTE — ED Provider Notes (Addendum)
[facility] EMERGENCY DEPARTMENT Provider Note   CSN: [id number] Arrival date & time: 10/26/17  1418     History   Chief Complaint Chief Complaint  Patient presents with  . Emesis  . Chest Pain    HPI Arthur Cook is a 71 y.o. male.  71 year old male presents with epigastric pain times several days.  Does have history of gastric ulcers and also suffers from chronic migraines.  His current headache is similar to his migraines in the past.  He has had some nausea with nonbilious emesis.  No fever or chills.  No neck pain.  Has been treating his headaches with NSAIDs which she says seems to irritate things.  Has had similar symptoms in the past relieved with antacids and he tried Pepto-Bismol without relief.  Denies any anginal symptoms.  No black stools.  Denies any severe weakness with standing.      Past Medical History:  Diagnosis Date  . History of stomach ulcers   . Hypertension   . Renal disorder    frequent UTIs    There are no active problems to display for this patient.   Past Surgical History:  Procedure Laterality Date  . knee operations         Home Medications    Prior to Admission medications   Not on File    Family History No family history on file.  Social History Social History   Tobacco Use  . Smoking status: Former Smoker    Types: Cigarettes  . Smokeless tobacco: Never Used  Substance Use Topics  . Alcohol use: No    Frequency: Never  . Drug use: No     Allergies   Patient has no allergy information on record.   Review of Systems Review of Systems  All other systems reviewed and are negative.    Physical Exam Updated Vital Signs BP (!) 197/125 (BP Location: Right Arm) Comment: RN notified  Pulse 66   Temp 98 F (36.7 C) (Oral)   Resp 17   Ht 1.778 m (5\' 10" )   Wt 108.9 kg (240 lb)   SpO2 96%   BMI 34.44 kg/m   Physical Exam  Constitutional: He is oriented to person, place, and time. He  appears well-developed and well-nourished.  Non-toxic appearance. No distress.  HENT:  Head: Normocephalic and atraumatic.  Eyes: Conjunctivae, EOM and lids are normal. Pupils are equal, round, and reactive to light.  Neck: Normal range of motion. Neck supple. No tracheal deviation present. No thyroid mass present.  Cardiovascular: Normal rate, regular rhythm and normal heart sounds. Exam reveals no gallop.  No murmur heard. Pulmonary/Chest: Effort normal and breath sounds normal. No stridor. No respiratory distress. He has no decreased breath sounds. He has no wheezes. He has no rhonchi. He has no rales.  Abdominal: Soft. Normal appearance and bowel sounds are normal. He exhibits no distension. There is no tenderness. There is no rebound and no CVA tenderness.  Musculoskeletal: Normal range of motion. He exhibits no edema or tenderness.  Neurological: He is alert and oriented to person, place, and time. He has normal strength. No cranial nerve deficit or sensory deficit. GCS eye subscore is 4. GCS verbal subscore is 5. GCS motor subscore is 6.  Skin: Skin is warm and dry. No abrasion and no rash noted.  Psychiatric: He has a normal mood and affect. His speech is normal and behavior is normal.  Nursing note and vitals reviewed.  ED Treatments / Results  Labs (all labs ordered are listed, but only abnormal results are displayed) Labs Reviewed  CBC - Abnormal; Notable for the following components:      Result Value   WBC 15.4 (*)    All other components within normal limits  BASIC METABOLIC PANEL  LIPASE, BLOOD  HEPATIC FUNCTION PANEL  I-STAT TROPONIN, ED    EKG  EKG Interpretation  Date/Time:  Saturday October 26 2017 14:26:44 EST Ventricular Rate:  92 PR Interval:  150 QRS Duration: 84 QT Interval:  360 QTC Calculation: 445 R Axis:   -9 Text Interpretation:  Sinus rhythm with marked sinus arrhythmia Inferior Q waves (noted on prior) Abnormal ECG Confirmed by Rolland PorterJames, Mark  [phone]([id number]) on 10/26/2017 2:33:07 PM Also confirmed by Rolland PorterJames, Mark ([id number]), editor Madalyn RobEverhart, Marilyn [phone])  on 10/26/2017 2:55:30 PM Also confirmed by Lorre NickAllen, Kalyani Maeda ([id number])  on 10/26/2017 4:31:03 PM       Radiology Dg Chest 2 View  Result Date: 10/26/2017 CLINICAL DATA:  Chest pain. EXAM: CHEST  2 VIEW COMPARISON:  Two-view chest x-ray 11/06/2007 FINDINGS: The heart size and mediastinal contours are within normal limits. Both lungs are clear. The visualized skeletal structures are unremarkable. IMPRESSION: Negative two view chest x-ray Electronically Signed   By: Marin Robertshristopher  Mattern M.D.   On: 10/26/2017 15:22    Procedures Procedures (including critical care time)  Medications Ordered in ED Medications  sodium chloride 0.9 % bolus 1,000 mL (not administered)  0.9 %  sodium chloride infusion (not administered)  metoCLOPramide (REGLAN) injection 5 mg (not administered)  diphenhydrAMINE (BENADRYL) injection 12.5 mg (not administered)  pantoprazole (PROTONIX) injection 40 mg (not administered)  gi cocktail (Maalox,Lidocaine,Donnatal) (not administered)  sucralfate (CARAFATE) tablet 1 g (not administered)     Initial Impression / Assessment and Plan / ED Course  I have reviewed the triage vital signs and the nursing notes.  Pertinent labs & imaging results that were available during my care of the patient were reviewed by me and considered in my medical decision making (see chart for details).     Patient medicated here for pain as well as given IV fluids here.  And still complains of nausea and emesis as well as abdominal discomfort.  Patient had abdominal CT which shows evidence of gastric outlet obstruction.  Spoke with Dr. Andrey CampanileWilson from general surgery who will come and see the patient.  NG tube to be placed by nursing.   11:23 PM Patient seen by general surgery who requests admission by the medicine team with a GI consult  Final Clinical Impressions(s) / ED Diagnoses   Final  diagnoses:  None    ED Discharge Orders    None       Lorre NickAllen, Berdena Cisek, MD 10/26/17 2221    Lorre NickAllen, Aricka Goldberger, MD 10/26/17 2323

## 2017-10-26 NOTE — ED Triage Notes (Addendum)
Pt c/o "burning in upper chest-- with vomiting everything that I drink or eat. " has been taking BC powders for 3 days, states "I am not supposed to take them because of ulcers-- but I have had a migraine" states vomit is dark colored,  Pt also states he had blood in urine 2 weeks ago 3 episodes, none now,

## 2017-10-27 ENCOUNTER — Encounter (HOSPITAL_COMMUNITY): Payer: Self-pay | Admitting: Family Medicine

## 2017-10-27 ENCOUNTER — Inpatient Hospital Stay (HOSPITAL_COMMUNITY): Payer: Non-veteran care

## 2017-10-27 ENCOUNTER — Other Ambulatory Visit: Payer: Self-pay

## 2017-10-27 DIAGNOSIS — K2980 Duodenitis without bleeding: Secondary | ICD-10-CM | POA: Diagnosis present

## 2017-10-27 DIAGNOSIS — Z79899 Other long term (current) drug therapy: Secondary | ICD-10-CM | POA: Diagnosis not present

## 2017-10-27 DIAGNOSIS — R112 Nausea with vomiting, unspecified: Secondary | ICD-10-CM | POA: Diagnosis present

## 2017-10-27 DIAGNOSIS — I10 Essential (primary) hypertension: Secondary | ICD-10-CM | POA: Diagnosis present

## 2017-10-27 DIAGNOSIS — Z87891 Personal history of nicotine dependence: Secondary | ICD-10-CM | POA: Diagnosis not present

## 2017-10-27 DIAGNOSIS — K311 Adult hypertrophic pyloric stenosis: Secondary | ICD-10-CM | POA: Diagnosis present

## 2017-10-27 DIAGNOSIS — G4700 Insomnia, unspecified: Secondary | ICD-10-CM | POA: Insufficient documentation

## 2017-10-27 DIAGNOSIS — Z8711 Personal history of peptic ulcer disease: Secondary | ICD-10-CM | POA: Diagnosis not present

## 2017-10-27 DIAGNOSIS — N289 Disorder of kidney and ureter, unspecified: Secondary | ICD-10-CM

## 2017-10-27 DIAGNOSIS — I129 Hypertensive chronic kidney disease with stage 1 through stage 4 chronic kidney disease, or unspecified chronic kidney disease: Secondary | ICD-10-CM | POA: Diagnosis present

## 2017-10-27 DIAGNOSIS — Z791 Long term (current) use of non-steroidal anti-inflammatories (NSAID): Secondary | ICD-10-CM | POA: Diagnosis not present

## 2017-10-27 DIAGNOSIS — K920 Hematemesis: Secondary | ICD-10-CM | POA: Diagnosis present

## 2017-10-27 DIAGNOSIS — R911 Solitary pulmonary nodule: Secondary | ICD-10-CM | POA: Diagnosis present

## 2017-10-27 DIAGNOSIS — K2210 Ulcer of esophagus without bleeding: Secondary | ICD-10-CM | POA: Diagnosis present

## 2017-10-27 DIAGNOSIS — R319 Hematuria, unspecified: Secondary | ICD-10-CM | POA: Diagnosis not present

## 2017-10-27 DIAGNOSIS — G43909 Migraine, unspecified, not intractable, without status migrainosus: Secondary | ICD-10-CM | POA: Diagnosis present

## 2017-10-27 DIAGNOSIS — N2889 Other specified disorders of kidney and ureter: Secondary | ICD-10-CM | POA: Diagnosis present

## 2017-10-27 DIAGNOSIS — Z8719 Personal history of other diseases of the digestive system: Secondary | ICD-10-CM | POA: Diagnosis not present

## 2017-10-27 DIAGNOSIS — N181 Chronic kidney disease, stage 1: Secondary | ICD-10-CM | POA: Diagnosis present

## 2017-10-27 DIAGNOSIS — Z8546 Personal history of malignant neoplasm of prostate: Secondary | ICD-10-CM | POA: Diagnosis not present

## 2017-10-27 LAB — BASIC METABOLIC PANEL
Anion gap: 10 (ref 5–15)
BUN: 15 mg/dL (ref 6–20)
CALCIUM: 9.7 mg/dL (ref 8.9–10.3)
CHLORIDE: 102 mmol/L (ref 101–111)
CO2: 29 mmol/L (ref 22–32)
CREATININE: 1.3 mg/dL — AB (ref 0.61–1.24)
GFR calc non Af Amer: 53 mL/min — ABNORMAL LOW (ref >60)
GLUCOSE: 172 mg/dL — AB (ref 65–99)
Potassium: 4.1 mmol/L (ref 3.5–5.1)
Sodium: 141 mmol/L (ref 135–145)

## 2017-10-27 LAB — ABO/RH: ABO/RH(D): A POS

## 2017-10-27 LAB — HEMOGLOBIN AND HEMATOCRIT, BLOOD
HCT: 41.5 % (ref 39.0–52.0)
HEMATOCRIT: 43.1 % (ref 39.0–52.0)
HEMATOCRIT: 42.6 % (ref 39.0–52.0)
HEMOGLOBIN: 13.8 g/dL (ref 13.0–17.0)
HEMOGLOBIN: 13.3 g/dL (ref 13.0–17.0)
HEMOGLOBIN: 13.6 g/dL (ref 13.0–17.0)

## 2017-10-27 LAB — TYPE AND SCREEN
ABO/RH(D): A POS
ANTIBODY SCREEN: NEGATIVE

## 2017-10-27 LAB — GLUCOSE, CAPILLARY: Glucose-Capillary: 138 mg/dL — ABNORMAL HIGH (ref 65–99)

## 2017-10-27 MED ORDER — FENTANYL CITRATE (PF) 100 MCG/2ML IJ SOLN
25.0000 ug | INTRAMUSCULAR | Status: DC | PRN
  Administered 2017-10-27 – 2017-10-29 (×3): 50 ug via INTRAVENOUS
  Filled 2017-10-27 (×4): qty 2

## 2017-10-27 MED ORDER — POLYVINYL ALCOHOL 1.4 % OP SOLN: 1.0000 [drp] | Freq: Four times a day (QID) | OPHTHALMIC | Status: DC | PRN

## 2017-10-27 MED ORDER — ONDANSETRON HCL 4 MG/2ML IJ SOLN: 4.0000 mg | Freq: Four times a day (QID) | INTRAMUSCULAR | Status: DC | PRN

## 2017-10-27 MED ORDER — PANTOPRAZOLE SODIUM 40 MG IV SOLR
40.0000 mg | Freq: Once | INTRAVENOUS | Status: AC
Start: 2017-10-27 — End: 2017-10-27
  Administered 2017-10-27: 40 mg via INTRAVENOUS
  Filled 2017-10-27: qty 40

## 2017-10-27 MED ORDER — ACETAMINOPHEN 650 MG RE SUPP
650.0000 mg | Freq: Four times a day (QID) | RECTAL | Status: DC | PRN
  Filled 2017-10-27: qty 1

## 2017-10-27 MED ORDER — ACETAMINOPHEN 325 MG PO TABS
650.0000 mg | ORAL_TABLET | Freq: Four times a day (QID) | ORAL | Status: DC | PRN
  Administered 2017-10-28 – 2017-10-29 (×2): 650 mg via ORAL
  Filled 2017-10-27 (×2): qty 2

## 2017-10-27 MED ORDER — SODIUM CHLORIDE 0.9 % IV SOLN
INTRAVENOUS | Status: AC
  Administered 2017-10-27: 01:00:00 via INTRAVENOUS

## 2017-10-27 MED ORDER — HYDRALAZINE HCL 20 MG/ML IJ SOLN
5.0000 mg | INTRAMUSCULAR | Status: DC | PRN
  Administered 2017-10-27 – 2017-10-29 (×3): 5 mg via INTRAVENOUS
  Filled 2017-10-27 (×3): qty 1

## 2017-10-27 MED ORDER — ONDANSETRON HCL 4 MG PO TABS: 4.0000 mg | ORAL_TABLET | Freq: Four times a day (QID) | ORAL | Status: DC | PRN

## 2017-10-27 MED ORDER — PHENOL 1.4 % MT LIQD
1.0000 | OROMUCOSAL | Status: DC | PRN
  Administered 2017-10-27: 1 via OROMUCOSAL
  Filled 2017-10-27: qty 177

## 2017-10-27 MED ORDER — SODIUM CHLORIDE 0.9 % IV SOLN
8.0000 mg/h | INTRAVENOUS | Status: DC
  Administered 2017-10-27 – 2017-10-28 (×5): 8 mg/h via INTRAVENOUS
  Filled 2017-10-27 (×9): qty 80

## 2017-10-27 MED ORDER — SODIUM CHLORIDE 0.9% FLUSH
3.0000 mL | Freq: Two times a day (BID) | INTRAVENOUS | Status: DC
  Administered 2017-10-27 – 2017-10-30 (×4): 3 mL via INTRAVENOUS

## 2017-10-27 MED ORDER — LORAZEPAM 2 MG/ML IJ SOLN
0.5000 mg | Freq: Every evening | INTRAMUSCULAR | Status: DC | PRN
  Administered 2017-10-27 – 2017-10-28 (×3): 1 mg via INTRAVENOUS
  Filled 2017-10-27 (×3): qty 1

## 2017-10-27 MED ORDER — PANTOPRAZOLE SODIUM 40 MG IV SOLR
40.0000 mg | Freq: Two times a day (BID) | INTRAVENOUS | Status: DC
  Filled 2017-10-27: qty 40

## 2017-10-27 NOTE — Progress Notes (Signed)
I have seen patient and agree with history of physical as documented per Dr. Marga Hootsakley I have also reviewed general surgery notes and their consult plans I spoke to Dr. Ewing SchleinMagod who agrees with current POC at this time in terms of stomach decompression with NG-T Patient will be evaluated by GI for Scope later on this admit non-emergently per Dr. Leonard DowningMagod  Jai Sharbel Sahagun, MD [facility]) [phone]

## 2017-10-27 NOTE — ED Notes (Signed)
Patient is stable and ready to be transport to the floor at this time.  Report was called to [employer]M [employer] RN.  Belongings taken with the patient to the floor.

## 2017-10-27 NOTE — Progress Notes (Signed)
Patients B/P is 178/92 after Hydralazine PRN given. MD notified. No further orders. Will continue to monitor.

## 2017-10-27 NOTE — H&P (Signed)
History and Physical    Arthur PaCarey C Kilgore ZOX:[id number] DOB: July 28, 1946 DOA: 10/26/2017  PCP: [facility]   Patient coming from: Home  Chief Complaint: Burning epigastric/chest pain with nausea and dark vomitus   HPI: Arthur Cook is a 71 y.o. male with medical history significant for hypertension, peptic ulcer disease, and chronic migraines, now presenting to the emergency department for evaluation of pain in the epigastrium and chest with nausea and dark vomitus.  Patient reports that he has previously followed with [facility] and is undergone endoscopy with dilation multiple times.  He has history of PUD and was advised to avoid NSAIDs.  He has been suffering from frequent migraines, has not been prescribed enough sumatriptan to treat all of his headaches, and has therefore resorted to using BC powders and Aleve.  He developed a burning pain in the chest and epigastrium yesterday and also developed nausea with dark brown vomitus yesterday.  The pain, described as burning, constant, localized, and without alleviating or exacerbating factors, has persisted and he is continued to have vomiting.  Denies any lightheadedness, chest pain, or palpitations.  Headache is mild at this time and consistent with his chronic headaches.  No recent fevers or chills and no cough or dyspnea.  ED Course: Upon arrival to the ED, patient is found to be afebrile, saturating well on room air, and with vitals otherwise stable.  EKG features a sinus rhythm and chest x-ray is negative for acute cardiopulmonary disease.  Chemistry panel reveals a creatinine 1.33 with no recent prior available for comparison.  CBC is notable for a leukocytosis to 15,400 and troponin is undetectable.  CT of the abdomen and pelvis demonstrates marked stomach distention with mild thickening at the distal antrum and pylorus consistent with gastric outlet obstruction of unknown etiology.  Also noted on the CT is a right kidney mass in the upper  pole, likely complex cyst, but with ultrasound recommended for further evaluation.  There is also incidental notation of a right lung base nodule on the CT.  Patient was given a liter of normal saline and surgery was consulted by the ED physician.  Surgeon has evaluated the patient in the emergency department and recommended NG tube, bowel rest, IV Protonix, gastroenterology consult for EGD, and renal ultrasound.  Patient was treated with Reglan, morphine, Carafate, and 40 mg IV Protonix.  NG tube was placed and has drained more than 2 L of dark liquid.  Patient has remained hemodynamically stable in the ED, is not in any apparent respiratory distress, and will be admitted to the telemetry unit for ongoing evaluation and management gastric outlet obstruction, likely secondary to PUD, but with malignancy not excluded.  Review of Systems:  All other systems reviewed and apart from HPI, are negative.  Past Medical History:  Diagnosis Date  . History of stomach ulcers   . Hypertension   . Renal disorder    frequent UTIs    Past Surgical History:  Procedure Laterality Date  . knee operations       reports that he has quit smoking. His smoking use included cigarettes. he has never used smokeless tobacco. He reports that he does not drink alcohol or use drugs.  No Known Allergies  Family History  Problem Relation Age of Onset  . Obesity Daughter      Prior to Admission medications   Medication Sig Start Date End Date Taking? Authorizing Provider  Aspirin-Salicylamide-Caffeine (BC HEADACHE POWDER PO) Take 2 packets by mouth  3 (three) times daily as needed (headache).   Yes [provider]  ATENOLOL PO Take 1 tablet by mouth daily as needed (blood pressure over 170/100).   Yes [provider]  ATORVASTATIN CALCIUM PO Take 0.5 tablets by mouth daily.   Yes [provider]  Celecoxib (CELEBREX PO) Take 1 capsule by mouth daily.   Yes [provider]    cholecalciferol (VITAMIN D) 1000 units tablet Take 1,000 Units by mouth daily.   Yes [provider]  Cyanocobalamin (VITAMIN B-12 PO) Take 2 tablets by mouth daily.   Yes [provider]  HYDROcodone-acetaminophen (NORCO/VICODIN) 5-325 MG tablet Take 2 tablets by mouth at bedtime as needed for moderate pain.   Yes [provider]  lisinopril (PRINIVIL,ZESTRIL) 10 MG tablet Take 5 mg by mouth daily.   Yes [provider]  OMEPRAZOLE PO Take 1-2 capsules by mouth daily as needed (acid reflux/ indigestion).   Yes [provider]  polyvinyl alcohol (ARTIFICIAL TEARS) 1.4 % ophthalmic solution Place 1 drop into both eyes 4 (four) times daily as needed for dry eyes.   Yes [provider]  QUETIAPINE FUMARATE PO Take 1 tablet by mouth at bedtime.   Yes [provider]  SERTRALINE HCL PO Take 0.5 tablets by mouth daily.   Yes [provider]  SUMATRIPTAN SUCCINATE IJ Inject as directed daily as needed (migraine headache).   Yes [provider]  TAMSULOSIN HCL PO Take 1 capsule by mouth 2 (two) times daily.   Yes [provider]  Thiamine HCl (THIAMINE PO) Take 1 tablet by mouth daily.   Yes [provider]    Physical Exam: Vitals:   10/26/17 2215 10/26/17 2230 10/26/17 2315 10/27/17 0000  BP: (!) 179/103 (!) 202/120 (!) 172/99 (!) 176/108  Pulse: (!) 106 (!) 111 (!) 109 100  Resp: 20 (!) 27 16 17   Temp:      TempSrc:      SpO2: 91% 91% 90% 90%  Weight:      Height:          Constitutional: NAD, calm, well-appearing Eyes: PERTLA, lids and conjunctivae normal ENMT: Mucous membranes are moist. Posterior pharynx clear of any exudate or lesions.   Neck: normal, supple, no masses, no thyromegaly Respiratory: clear to auscultation bilaterally, no wheezing, no crackles. Normal respiratory effort.    Cardiovascular: S1 & S2 heard, regular rate and rhythm. No significant JVD. Abdomen: No distension,  soft, tender in epigastrium, no rebound pain or guarding. Bowel sounds active.  Musculoskeletal: no clubbing / cyanosis. No joint deformity upper and lower extremities.  Skin: no significant rashes, lesions, ulcers. Warm, dry, well-perfused. Neurologic: CN 2-12 grossly intact. Sensation intact. Strength 5/5 in all 4 limbs.  Psychiatric: Alert and oriented x 3. Pleasant and cooperative.     Labs on Admission: I have personally reviewed following labs and imaging studies  CBC: Recent Labs  Lab 10/26/17 1450  WBC 15.4*  HGB 15.5  HCT 46.6  MCV 86.8  PLT 338   Basic Metabolic Panel: Recent Labs  Lab 10/26/17 1450  NA 141  K 3.7  CL 98*  CO2 32  GLUCOSE 146*  BUN 17  CREATININE 1.33*  CALCIUM 10.8*   GFR: Estimated Creatinine Clearance: 61.2 mL/min (A) (by C-G formula based on SCr of 1.33 mg/dL (H)). Liver Function Tests: Recent Labs  Lab 10/26/17 1643  AST 23  ALT 25  ALKPHOS 52  BILITOT 0.7  PROT 8.1  ALBUMIN  4.6   Recent Labs  Lab 10/26/17 1643  LIPASE 48   No results for input(s): AMMONIA in the last 168 hours. Coagulation Profile: No results for input(s): INR, PROTIME in the last 168 hours. Cardiac Enzymes: No results for input(s): CKTOTAL, CKMB, CKMBINDEX, TROPONINI in the last 168 hours. BNP (last 3 results) No results for input(s): PROBNP in the last 8760 hours. HbA1C: No results for input(s): HGBA1C in the last 72 hours. CBG: No results for input(s): GLUCAP in the last 168 hours. Lipid Profile: No results for input(s): CHOL, HDL, LDLCALC, TRIG, CHOLHDL, LDLDIRECT in the last 72 hours. Thyroid Function Tests: No results for input(s): TSH, T4TOTAL, FREET4, T3FREE, THYROIDAB in the last 72 hours. Anemia Panel: No results for input(s): VITAMINB12, FOLATE, FERRITIN, TIBC, IRON, RETICCTPCT in the last 72 hours. Urine analysis: No results found for: COLORURINE, APPEARANCEUR, LABSPEC, PHURINE, GLUCOSEU, HGBUR, BILIRUBINUR, KETONESUR, PROTEINUR,  UROBILINOGEN, NITRITE, LEUKOCYTESUR Sepsis Labs: @LABRCNTIP (procalcitonin:4,lacticidven:4) )No results found for this or any previous visit (from the past 240 hour(s)).   Radiological Exams on Admission: Dg Chest 2 View  Result Date: 10/26/2017 CLINICAL DATA:  Chest pain. EXAM: CHEST  2 VIEW COMPARISON:  Two-view chest x-ray 11/06/2007 FINDINGS: The heart size and mediastinal contours are within normal limits. Both lungs are clear. The visualized skeletal structures are unremarkable. IMPRESSION: Negative two view chest x-ray Electronically Signed   By: Marin Roberts M.D.   On: 10/26/2017 15:22   Ct Abdomen Pelvis W Contrast  Result Date: 10/26/2017 CLINICAL DATA:  Dark colored vomiting.  Hematuria 2 weeks ago. EXAM: CT ABDOMEN AND PELVIS WITH CONTRAST TECHNIQUE: Multidetector CT imaging of the abdomen and pelvis was performed using the standard protocol following bolus administration of intravenous contrast. CONTRAST:  ISOVUE-300 IOPAMIDOL (ISOVUE-300) INJECTION 61% COMPARISON:  None. FINDINGS: Lower chest: There is a nodule in the right lung on series 4, image 2 adjacent to the fissure measuring 6.7 mm. No other acute abnormalities are identified in the lung bases. Hepatobiliary: Hepatic steatosis. The liver, portal vein, and gallbladder are otherwise normal. Pancreas: Focal rounded fat in the pancreas on series 3, image 47 of no significance. The pancreas is otherwise normal. Spleen: Normal in size without focal abnormality. Adrenals/Urinary Tract: There is a 12 mm nodule in the left adrenal gland on series 3, image 30, too small to characterize. The right adrenal gland is normal. There is a 3.5 cm mass off the upper pole the right kidney with an attenuation of 24 Hounsfield units. No other renal masses. No hydronephrosis or perinephric stranding. No ureterectasis or ureteral stone. The bladder is normal. Stomach/Bowel: The stomach is markedly distended with fluid and debris. The proximal  duodenum is normal in caliber. The small bowel is otherwise normal. Colonic diverticulosis is seen without focal diverticulitis. The visualized appendix is normal. Vascular/Lymphatic: Atherosclerotic changes are seen in the nonaneurysmal aorta, iliac vessels common femoral vessels. No adenopathy. Reproductive: Prostate calcifications are identified. The prostate is otherwise normal. Other: There is mild increased attenuation in the fat of the gastrohepatic ligament along the lesser curvature of the stomach. No free air or free fluid. Musculoskeletal: No acute or significant osseous findings. IMPRESSION: 1. There is marked distension of the stomach. There may be mild thickening in the region of the distal antrum and pylorus based on coronal images. The findings are most consistent with gastric outlet obstruction of unknown etiology. The mild increased attenuation in the fat of the gastrohepatic ligament could be due to inflammation or venous congestion given the  gastric distention. 2. There is a mass in the upper pole the right kidney which is not definitely cystic based on today's study. I suspect this is a complicated cyst but a solid mass is not excluded. Recommend ultrasound for further evaluation. If the mass is not definitively cystic on ultrasound, recommend MRI. 3. 6.7 mm nodule in the right lung base. Non-contrast chest CT at 6-12 months is recommended. If the nodule is stable at time of repeat CT, then future CT at 18-24 months (from today's scan) is considered optional for low-risk patients, but is recommended for high-risk patients. This recommendation follows the consensus statement: Guidelines for Management of Incidental Pulmonary Nodules Detected on CT Images: From the Fleischner Society 2017; Radiology 2017; 284:228-243. 4. 12 mm nodule in the left adrenal gland is too small to characterize. If the patient does not have a known malignancy, recommend a 12 month follow-up CT scan to ensure stability.  5. Atherosclerotic change in the abdominal aorta. Electronically Signed   By: Gerome Sam III M.D   On: 10/26/2017 21:45    EKG: Independently reviewed. Sinus rhythm with sinus arrhythmia.   Assessment/Plan  1. Gastric outlet obstruction; PUD; UGIB  - Pt presents with 2 days of epigastric pain and nausea with dark brown vomitus  - Hemodynamically stable in ED with normal H&H  - CT abd/pelvis suggests GOO and surgery has evaluated pt in ED, suspecting PUD but unable to exclude malignancy  - NGT placed and drained >2L dark brown material  - Plan to continue bowel rest and NGT decompression, start IV PPI, consult with GI in am, follow H&H, IVF hydration, prn antiemetics and analgesia    2. Right kidney lesion  - Lesion noted on CT involving upper pole of right kidney  - Will evaluate further with renal US   3. Chronic migraine  - Mild HA on admission  - No focal neurologic findings  - Reports that sumatriptan effective, but has near-daily migraine and only allowed 10 injections/mo by [place], so he has resorted to [facility] powders and Aleve   - Advised against NSAID going forward  - Currently NPO and will be treated with prn IV analgesics    4. Hypertension  - BP at goal  - NPO on admission, managed with lisinopril at home  - Use hydralazine IVP's prn for now   5. Kidney disease of uncertain chronicity  - SCr is 1.33 on admission with no recent prior available  - Acute prerenal azotemia possible in setting of N/V and anorexia x2 days  - Given a liter of NS in ED and continued on NS infusion overnight, will repeat chem panel in am    6. Insomnia   - Managed with Seroquel at home, currently NPO  - Trial parenteral Ativan tonight   7. Incidental lung nodule - Incidental nodule noted in right lung base on CT abd/pelvis  - Follow-up imaging recommended    DVT prophylaxis: SCD's Code Status: Full  Family Communication: Wife and daughter updated at bedside Disposition Plan: Admit to  telemetry Consults called: Surgery Admission status: Inpatient    Briscoe Deutscher, MD [facility] Pager [phone]  If 7PM-7AM, please contact night-coverage [email or web address] Password [email or web address]  10/27/2017, 12:44 AM

## 2017-10-27 NOTE — Progress Notes (Signed)
Patient refusing chair alarm. Education provided. Will continue to monitor.

## 2017-10-27 NOTE — Progress Notes (Signed)
Patient's nasogastric tube laying on the bed,when asked patient why he pulled it out. Patient stated "I didn't know I pulled it out","I must have done it in my sleep." MD on call notified. Order received to replace NGT. Ayame Rena, Drinda Buttsharito Joselita, RCharity fundraiser

## 2017-10-27 NOTE — Progress Notes (Signed)
Subjective/Chief Complaint: Comfortable Denies abdominal pain   Objective: Vital signs in last 24 hours: Temp:  [98 F (36.7 C)-98.3 F (36.8 C)] 98.1 F (36.7 C) (12/09 0820) Pulse Rate:  [66-111] 92 (12/09 0820) Resp:  [11-27] 18 (12/09 0820) BP: (156-202)/(89-125) 174/99 (12/09 0820) SpO2:  [89 %-100 %] 93 % (12/09 0820) Weight:  [108.9 kg (240 lb)] 108.9 kg (240 lb) (12/08 1435) Last BM Date: 10/25/17  Intake/Output from previous day: 12/08 0701 - 12/09 0700 In: 1000 [IV Piggyback:1000] Out: 2000 [Urine:200; Emesis/NG output:1800] Intake/Output this shift: Total I/O In: 962.5 [I.V.:962.5] Out: 100 [Urine:100]  Exam: Looks comfortable Lungs clear Abdomen soft, NT, ND  Lab Results:  Recent Labs    10/26/17 1450 10/27/17 0135  WBC 15.4*  --   HGB 15.5 13.8  HCT 46.6 43.1  PLT 338  --    BMET Recent Labs    10/26/17 1450 10/27/17 0135  NA 141 141  K 3.7 4.1  CL 98* 102  CO2 32 29  GLUCOSE 146* 172*  BUN 17 15  CREATININE 1.33* 1.30*  CALCIUM 10.8* 9.7   PT/INR No results for input(s): LABPROT, INR in the last 72 hours. ABG No results for input(s): PHART, HCO3 in the last 72 hours.  Invalid input(s): PCO2, PO2  Studies/Results: Dg Chest 2 View  Result Date: 10/26/2017 CLINICAL DATA:  Chest pain. EXAM: CHEST  2 VIEW COMPARISON:  Two-view chest x-ray 11/06/2007 FINDINGS: The heart size and mediastinal contours are within normal limits. Both lungs are clear. The visualized skeletal structures are unremarkable. IMPRESSION: Negative two view chest x-ray Electronically Signed   By: Marin Robertshristopher  Mattern M.D.   On: 10/26/2017 15:22   Ct Abdomen Pelvis W Contrast  Result Date: 10/26/2017 CLINICAL DATA:  Dark colored vomiting.  Hematuria 2 weeks ago. EXAM: CT ABDOMEN AND PELVIS WITH CONTRAST TECHNIQUE: Multidetector CT imaging of the abdomen and pelvis was performed using the standard protocol following bolus administration of intravenous contrast.  CONTRAST:  100mL ISOVUE-300 IOPAMIDOL (ISOVUE-300) INJECTION 61% COMPARISON:  None. FINDINGS: Lower chest: There is a nodule in the right lung on series 4, image 2 adjacent to the fissure measuring 6.7 mm. No other acute abnormalities are identified in the lung bases. Hepatobiliary: Hepatic steatosis. The liver, portal vein, and gallbladder are otherwise normal. Pancreas: Focal rounded fat in the pancreas on series 3, image 47 of no significance. The pancreas is otherwise normal. Spleen: Normal in size without focal abnormality. Adrenals/Urinary Tract: There is a 12 mm nodule in the left adrenal gland on series 3, image 30, too small to characterize. The right adrenal gland is normal. There is a 3.5 cm mass off the upper pole the right kidney with an attenuation of 24 Hounsfield units. No other renal masses. No hydronephrosis or perinephric stranding. No ureterectasis or ureteral stone. The bladder is normal. Stomach/Bowel: The stomach is markedly distended with fluid and debris. The proximal duodenum is normal in caliber. The small bowel is otherwise normal. Colonic diverticulosis is seen without focal diverticulitis. The visualized appendix is normal. Vascular/Lymphatic: Atherosclerotic changes are seen in the nonaneurysmal aorta, iliac vessels common femoral vessels. No adenopathy. Reproductive: Prostate calcifications are identified. The prostate is otherwise normal. Other: There is mild increased attenuation in the fat of the gastrohepatic ligament along the lesser curvature of the stomach. No free air or free fluid. Musculoskeletal: No acute or significant osseous findings. IMPRESSION: 1. There is marked distension of the stomach. There may be mild thickening in the region  of the distal antrum and pylorus based on coronal images. The findings are most consistent with gastric outlet obstruction of unknown etiology. The mild increased attenuation in the fat of the gastrohepatic ligament could be due to  inflammation or venous congestion given the gastric distention. 2. There is a mass in the upper pole the right kidney which is not definitely cystic based on today's study. I suspect this is a complicated cyst but a solid mass is not excluded. Recommend ultrasound for further evaluation. If the mass is not definitively cystic on ultrasound, recommend MRI. 3. 6.7 mm nodule in the right lung base. Non-contrast chest CT at 6-12 months is recommended. If the nodule is stable at time of repeat CT, then future CT at 18-24 months (from today's scan) is considered optional for low-risk patients, but is recommended for high-risk patients. This recommendation follows the consensus statement: Guidelines for Management of Incidental Pulmonary Nodules Detected on CT Images: From the Fleischner Society 2017; Radiology 2017; 284:228-243. 4. 12 mm nodule in the left adrenal gland is too small to characterize. If the patient does not have a known malignancy, recommend a 12 month follow-up CT scan to ensure stability. 5. Atherosclerotic change in the abdominal aorta. Electronically Signed   By: Gerome Samavid  Williams III M.D   On: 10/26/2017 21:45   [place] Renal  Result Date: 10/27/2017 CLINICAL DATA:  71 year old male with right renal lesion seen on earlier CT. EXAM: RENAL / URINARY TRACT ULTRASOUND COMPLETE COMPARISON:  Abdominal CT dated 10/26/2017 FINDINGS: Evaluation is limited due to patient's body habitus. Right Kidney: Length: 11.4 cm. There is a 3.2 x 3.9 x 3.4 cm hypoechoic lesion in the upper pole of the right kidney which is indeterminate and not well characterized. This corresponds to the low attenuating lesion seen on the earlier CT. No hydronephrosis or shadowing stone. Left Kidney: Length: 12.2 cm. Normal echogenicity. No hydronephrosis or shadowing stone. Bladder: Appears normal for degree of bladder distention. Incidental note of moderate to severe fatty liver. IMPRESSION: Hypoechoic lesion in the upper pole of the right  kidney is not well characterized on this ultrasound. Further characterization with MRI as previously recommended advised. Fatty liver. Electronically Signed   By: Elgie CollardArash  Radparvar M.D.   On: 10/27/2017 03:41    Anti-infectives: Anti-infectives (From admission, onward)   None      Assessment/Plan:  Gastric outlet obstruction  Continue NPO, NG GI to see.  Will probably need upper endo Suspect from ulcer disease. May require surgery  LOS: 0 days    Arthur Cook A 10/27/2017

## 2017-10-27 NOTE — Progress Notes (Signed)
Patient known to me from what I believe was his last endoscopy for pyloric stenosis and recurrent ulcer disease from 2003 and an endoscopy from 2001 was found on the computer as well but he has not been seen in our office since our computer started and there are no recent records on him and his case discussed with the hospital team and his hospital computer chart reviewed and he is back on aspirin and now has a gastric outlet obstruction and is getting an NG tube and will need an endoscopy in a few days and will ask our team to see early next week and please call us back if we could be of any further assistance in the meantime and continue IV pump inhibitors

## 2017-10-28 ENCOUNTER — Inpatient Hospital Stay (HOSPITAL_COMMUNITY): Payer: Non-veteran care

## 2017-10-28 LAB — HEMOGLOBIN AND HEMATOCRIT, BLOOD
HCT: 40.8 % (ref 39.0–52.0)
HCT: 40.2 % (ref 39.0–52.0)
HEMATOCRIT: 41.1 % (ref 39.0–52.0)
HEMOGLOBIN: 12.7 g/dL — AB (ref 13.0–17.0)
Hemoglobin: 12.9 g/dL — ABNORMAL LOW (ref 13.0–17.0)
Hemoglobin: 13.1 g/dL (ref 13.0–17.0)

## 2017-10-28 LAB — GLUCOSE, CAPILLARY: GLUCOSE-CAPILLARY: 126 mg/dL — AB (ref 65–99)

## 2017-10-28 NOTE — H&P (View-Only) (Signed)
Referring Provider:  [facility] Primary Care Physician:  [facility] Primary Gastroenterologist:  Gentry Fitz  Reason for Consultation:  Gastric outlet obstruction  HPI: Arthur Cook is a 71 y.o. male with past medical history of pyloric stenosis and recurrent peptic ulcer disease admitted to the hospital for further evaluation of epigastric pain, nausea and coffee-ground emesis. CT abdomen pelvis with contrast was ordered on 10/26/2017 which showed markedly distended stomach with mild thickening in the region of the distal antrum and pylorus consistent with gastric outlet obstruction. GI is consulted for further evaluation.  Patient seen and examined at bedside. Arthur Cook has been taking 2-3 BCs powder mostly on a daily basis for his migraine headache. Arthur Cook started having epigastric pain and burning sensation around 3-4 days ago followed by nausea and coffee-ground emesis. Arthur Cook denied any black stool or bright blood per rectum. Occasional constipation. Denied any diarrhea. Arthur Cook has decreased appetite because of  epigastric burning sensation. Last bowel movement 2 days ago  EGD and colonoscopy 15 years ago. No family history of colon cancer in a first degree relatives Past Medical History:  Diagnosis Date  . History of stomach ulcers   . Hypertension   . Renal disorder    frequent UTIs    Past Surgical History:  Procedure Laterality Date  . knee operations      Prior to Admission medications   Medication Sig Start Date End Date Taking? Authorizing Provider  Aspirin-Salicylamide-Caffeine (BC HEADACHE POWDER PO) Take 2 packets by mouth 3 (three) times daily as needed (headache).   Yes [provider]  ATENOLOL PO Take 1 tablet by mouth daily as needed (blood pressure over 170/100).   Yes [provider]  ATORVASTATIN CALCIUM PO Take 0.5 tablets by mouth daily.   Yes [provider]  Celecoxib (CELEBREX PO) Take 1 capsule by mouth daily.   Yes [provider]   cholecalciferol (VITAMIN D) 1000 units tablet Take 1,000 Units by mouth daily.   Yes [provider]  Cyanocobalamin (VITAMIN B-12 PO) Take 2 tablets by mouth daily.   Yes [provider]  HYDROcodone-acetaminophen (NORCO/VICODIN) 5-325 MG tablet Take 2 tablets by mouth at bedtime as needed for moderate pain.   Yes [provider]  lisinopril (PRINIVIL,ZESTRIL) 10 MG tablet Take 5 mg by mouth daily.   Yes [provider]  OMEPRAZOLE PO Take 1-2 capsules by mouth daily as needed (acid reflux/ indigestion).   Yes [provider]  polyvinyl alcohol (ARTIFICIAL TEARS) 1.4 % ophthalmic solution Place 1 drop into both eyes 4 (four) times daily as needed for dry eyes.   Yes [provider]  QUETIAPINE FUMARATE PO Take 1 tablet by mouth at bedtime.   Yes [provider]  SERTRALINE HCL PO Take 0.5 tablets by mouth daily.   Yes [provider]  SUMATRIPTAN SUCCINATE IJ Inject as directed daily as needed (migraine headache).   Yes [provider]  TAMSULOSIN HCL PO Take 1 capsule by mouth 2 (two) times daily.   Yes [provider]  Thiamine HCl (THIAMINE PO) Take 1 tablet by mouth daily.   Yes [provider]    Scheduled Meds: . [START ON 10/30/2017] pantoprazole  40 mg Intravenous Q12H  . sodium chloride flush  3 mL Intravenous Q12H  . sucralfate  1 g Oral Once   Continuous Infusions: . pantoprozole (PROTONIX) infusion 8 mg/hr (10/27/17 2313)   PRN Meds:.acetaminophen **OR** acetaminophen, fentaNYL (SUBLIMAZE) injection, hydrALAZINE, LORazepam, ondansetron **OR** ondansetron (ZOFRAN) IV,  phenol, polyvinyl alcohol  Allergies as of 10/26/2017  . (No Known Allergies)    Family History  Problem Relation Age of Onset  . Obesity Daughter     Social History   Socioeconomic History  . Marital status: Married    Spouse name: Not on file  . Number of children: Not on file  . Years of education:  Not on file  . Highest education level: Not on file  Social Needs  . Financial resource strain: Not on file  . Food insecurity - worry: Not on file  . Food insecurity - inability: Not on file  . Transportation needs - medical: Not on file  . Transportation needs - non-medical: Not on file  Occupational History  . Not on file  Tobacco Use  . Smoking status: Former Smoker    Types: Cigarettes  . Smokeless tobacco: Never Used  Substance and Sexual Activity  . Alcohol use: No    Frequency: Never  . Drug use: No  . Sexual activity: Not on file  Other Topics Concern  . Not on file  Social History Narrative  . Not on file    Review of Systems: Review of Systems  Constitutional: Positive for malaise/fatigue. Negative for chills and fever.  HENT: Negative for hearing loss and tinnitus.   Eyes: Negative for blurred vision and double vision.  Respiratory: Negative for cough, hemoptysis and sputum production.   Cardiovascular: Negative for chest pain and palpitations.  Gastrointestinal: Positive for abdominal pain, heartburn, nausea and vomiting. Negative for blood in stool, constipation, diarrhea and melena.  Genitourinary: Negative for dysuria and urgency.  Musculoskeletal: Negative for myalgias and neck pain.  Skin: Negative for itching and rash.  Neurological: Positive for headaches. Negative for focal weakness and seizures.  Endo/Heme/Allergies: Does not bruise/bleed easily.  Psychiatric/Behavioral: Negative for hallucinations and suicidal ideas.    Physical Exam: Vital signs: Vitals:   10/28/17 0623 10/28/17 0813  BP: (!) 169/87 (!) 166/85  Pulse: 85 79  Resp:  18  Temp:  98.2 F (36.8 C)  SpO2:  92%   Last BM Date: 10/25/17 Physical Exam  Constitutional: Arthur Cook is oriented to person, place, and time. Arthur Cook appears well-developed. Arthur Cook appears distressed.  In mild distress from NG tube.  HENT:  Head: Normocephalic and atraumatic.  NG tube in place  Eyes: EOM are normal. No  scleral icterus.  Neck: Normal range of motion. Neck supple. No thyromegaly present.  Cardiovascular: Normal rate, regular rhythm and normal heart sounds.  Pulmonary/Chest: Effort normal and breath sounds normal. No respiratory distress.  Abdominal: Soft. Bowel sounds are normal. Arthur Cook exhibits no distension. There is no tenderness. There is no guarding.  Musculoskeletal: Normal range of motion. Arthur Cook exhibits no edema.  Neurological: Arthur Cook is alert and oriented to person, place, and time.  Skin: Skin is warm. No erythema.  Psychiatric: Arthur Cook has a normal mood and affect. Judgment and thought content normal.  Vitals reviewed.   GI:  Lab Results: Recent Labs    10/26/17 1450  10/27/17 0931 10/27/17 1804 10/28/17 0140  WBC 15.4*  --   --   --   --   HGB 15.5   < > 13.3 13.6 12.9*  HCT 46.6   < > 41.5 42.6 40.8  PLT 338  --   --   --   --    < > = values in this interval not displayed.   BMET Recent Labs    10/26/17 1450 10/27/17 0135  NA  141 141  K 3.7 4.1  CL 98* 102  CO2 32 29  GLUCOSE 146* 172*  BUN 17 15  CREATININE 1.33* 1.30*  CALCIUM 10.8* 9.7   LFT Recent Labs    10/26/17 1643  PROT 8.1  ALBUMIN 4.6  AST 23  ALT 25  ALKPHOS 52  BILITOT 0.7  BILIDIR <0.1*  IBILI NOT CALCULATED   PT/INR No results for input(s): LABPROT, INR in the last 72 hours.   Studies/Results: Dg Chest 2 View  Result Date: 10/26/2017 CLINICAL DATA:  Chest pain. EXAM: CHEST  2 VIEW COMPARISON:  Two-view chest x-ray 11/06/2007 FINDINGS: The heart size and mediastinal contours are within normal limits. Both lungs are clear. The visualized skeletal structures are unremarkable. IMPRESSION: Negative two view chest x-ray Electronically Signed   By: Marin Robertshristopher  Mattern M.D.   On: 10/26/2017 15:22   Ct Abdomen Pelvis W Contrast  Result Date: 10/26/2017 CLINICAL DATA:  Dark colored vomiting.  Hematuria 2 weeks ago. EXAM: CT ABDOMEN AND PELVIS WITH CONTRAST TECHNIQUE: Multidetector CT imaging of the  abdomen and pelvis was performed using the standard protocol following bolus administration of intravenous contrast. CONTRAST:  100mL ISOVUE-300 IOPAMIDOL (ISOVUE-300) INJECTION 61% COMPARISON:  None. FINDINGS: Lower chest: There is a nodule in the right lung on series 4, image 2 adjacent to the fissure measuring 6.7 mm. No other acute abnormalities are identified in the lung bases. Hepatobiliary: Hepatic steatosis. The liver, portal vein, and gallbladder are otherwise normal. Pancreas: Focal rounded fat in the pancreas on series 3, image 47 of no significance. The pancreas is otherwise normal. Spleen: Normal in size without focal abnormality. Adrenals/Urinary Tract: There is a 12 mm nodule in the left adrenal gland on series 3, image 30, too small to characterize. The right adrenal gland is normal. There is a 3.5 cm mass off the upper pole the right kidney with an attenuation of 24 Hounsfield units. No other renal masses. No hydronephrosis or perinephric stranding. No ureterectasis or ureteral stone. The bladder is normal. Stomach/Bowel: The stomach is markedly distended with fluid and debris. The proximal duodenum is normal in caliber. The small bowel is otherwise normal. Colonic diverticulosis is seen without focal diverticulitis. The visualized appendix is normal. Vascular/Lymphatic: Atherosclerotic changes are seen in the nonaneurysmal aorta, iliac vessels common femoral vessels. No adenopathy. Reproductive: Prostate calcifications are identified. The prostate is otherwise normal. Other: There is mild increased attenuation in the fat of the gastrohepatic ligament along the lesser curvature of the stomach. No free air or free fluid. Musculoskeletal: No acute or significant osseous findings. IMPRESSION: 1. There is marked distension of the stomach. There may be mild thickening in the region of the distal antrum and pylorus based on coronal images. The findings are most consistent with gastric outlet obstruction  of unknown etiology. The mild increased attenuation in the fat of the gastrohepatic ligament could be due to inflammation or venous congestion given the gastric distention. 2. There is a mass in the upper pole the right kidney which is not definitely cystic based on today's study. I suspect this is a complicated cyst but a solid mass is not excluded. Recommend ultrasound for further evaluation. If the mass is not definitively cystic on ultrasound, recommend MRI. 3. 6.7 mm nodule in the right lung base. Non-contrast chest CT at 6-12 months is recommended. If the nodule is stable at time of repeat CT, then future CT at 18-24 months (from today's scan) is considered optional for low-risk patients, but is recommended  for high-risk patients. This recommendation follows the consensus statement: Guidelines for Management of Incidental Pulmonary Nodules Detected on CT Images: From the Fleischner Society 2017; Radiology 2017; 284:228-243. 4. 12 mm nodule in the left adrenal gland is too small to characterize. If the patient does not have a known malignancy, recommend a 12 month follow-up CT scan to ensure stability. 5. Atherosclerotic change in the abdominal aorta. Electronically Signed   By: Gerome Samavid  Williams III M.D   On: 10/26/2017 21:45   [place] Renal  Result Date: 10/27/2017 CLINICAL DATA:  71 year old male with right renal lesion seen on earlier CT. EXAM: RENAL / URINARY TRACT ULTRASOUND COMPLETE COMPARISON:  Abdominal CT dated 10/26/2017 FINDINGS: Evaluation is limited due to patient's body habitus. Right Kidney: Length: 11.4 cm. There is a 3.2 x 3.9 x 3.4 cm hypoechoic lesion in the upper pole of the right kidney which is indeterminate and not well characterized. This corresponds to the low attenuating lesion seen on the earlier CT. No hydronephrosis or shadowing stone. Left Kidney: Length: 12.2 cm. Normal echogenicity. No hydronephrosis or shadowing stone. Bladder: Appears normal for degree of bladder distention.  Incidental note of moderate to severe fatty liver. IMPRESSION: Hypoechoic lesion in the upper pole of the right kidney is not well characterized on this ultrasound. Further characterization with MRI as previously recommended advised. Fatty liver. Electronically Signed   By: Elgie CollardArash  Radparvar M.D.   On: 10/27/2017 03:41   Dg Abd Portable 1v  Result Date: 10/28/2017 CLINICAL DATA:  Enteric tube placement. EXAM: PORTABLE ABDOMEN - 1 VIEW COMPARISON:  10/28/2017 abdomen radiograph FINDINGS: Normal bowel gas pattern. Enteric tube tip projects over gastric body. No radiopaque urinary stone identified. Bones are unremarkable. IMPRESSION: Enteric tube tip projects over gastric body. Electronically Signed   By: Mitzi HansenLance  Furusawa-Stratton M.D.   On: 10/28/2017 04:21   Dg Abd Portable 1v  Result Date: 10/28/2017 CLINICAL DATA:  71 y/o  M; nasogastric tube placement. EXAM: PORTABLE ABDOMEN - 1 VIEW COMPARISON:  None. FINDINGS: Enteric tube projects over proximal stomach, suggest advancement. Normal bowel gas pattern. Bones are unremarkable. IMPRESSION: Enteric tube tip projects over proximal stomach, suggest advancement. Electronically Signed   By: Mitzi HansenLance  Furusawa-Stratton M.D.   On: 10/28/2017 02:14    Impression/Plan: - Gastric outlet obstruction. Most likely from pyloric channel stenosis. - Coffee-ground emesis. From # 1  - History of peptic ulcer disease. - NSAID use  Recommendations --------------------------- - Patient's physical exam is benign today. No epigastric pain. No significant gastric distention. Continue NG suction for now. Plan for EGD tomorrow. - Patient was advised to avoid NSAIDs. - Arthur Cook also needs to be on lifelong PPI given his history of recurrent peptic ulcer disease and ongoing use of NSAIDs. - Further plan based on endoscopic finding tomorrow.   LOS: 1 day   Kathi DerParag Copper Kirtley  MD, FACP 10/28/2017, 8:34 AM  Contact #  [phone]

## 2017-10-28 NOTE — Consult Note (Signed)
Referring Provider:  [facility] Primary Care Physician:  [facility] Primary Gastroenterologist:  Gentry Fitz  Reason for Consultation:  Gastric outlet obstruction  HPI: Arthur Cook is a 71 y.o. male with past medical history of pyloric stenosis and recurrent peptic ulcer disease admitted to the hospital for further evaluation of epigastric pain, nausea and coffee-ground emesis. CT abdomen pelvis with contrast was ordered on 10/26/2017 which showed markedly distended stomach with mild thickening in the region of the distal antrum and pylorus consistent with gastric outlet obstruction. GI is consulted for further evaluation.  Patient seen and examined at bedside. He has been taking 2-3 BCs powder mostly on a daily basis for his migraine headache. He started having epigastric pain and burning sensation around 3-4 days ago followed by nausea and coffee-ground emesis. He denied any black stool or bright blood per rectum. Occasional constipation. Denied any diarrhea. He has decreased appetite because of  epigastric burning sensation. Last bowel movement 2 days ago  EGD and colonoscopy 15 years ago. No family history of colon cancer in a first degree relatives Past Medical History:  Diagnosis Date  . History of stomach ulcers   . Hypertension   . Renal disorder    frequent UTIs    Past Surgical History:  Procedure Laterality Date  . knee operations      Prior to Admission medications   Medication Sig Start Date End Date Taking? Authorizing Provider  Aspirin-Salicylamide-Caffeine (BC HEADACHE POWDER PO) Take 2 packets by mouth 3 (three) times daily as needed (headache).   Yes [provider]  ATENOLOL PO Take 1 tablet by mouth daily as needed (blood pressure over 170/100).   Yes [provider]  ATORVASTATIN CALCIUM PO Take 0.5 tablets by mouth daily.   Yes [provider]  Celecoxib (CELEBREX PO) Take 1 capsule by mouth daily.   Yes [provider]   cholecalciferol (VITAMIN D) 1000 units tablet Take 1,000 Units by mouth daily.   Yes [provider]  Cyanocobalamin (VITAMIN B-12 PO) Take 2 tablets by mouth daily.   Yes [provider]  HYDROcodone-acetaminophen (NORCO/VICODIN) 5-325 MG tablet Take 2 tablets by mouth at bedtime as needed for moderate pain.   Yes [provider]  lisinopril (PRINIVIL,ZESTRIL) 10 MG tablet Take 5 mg by mouth daily.   Yes [provider]  OMEPRAZOLE PO Take 1-2 capsules by mouth daily as needed (acid reflux/ indigestion).   Yes [provider]  polyvinyl alcohol (ARTIFICIAL TEARS) 1.4 % ophthalmic solution Place 1 drop into both eyes 4 (four) times daily as needed for dry eyes.   Yes [provider]  QUETIAPINE FUMARATE PO Take 1 tablet by mouth at bedtime.   Yes [provider]  SERTRALINE HCL PO Take 0.5 tablets by mouth daily.   Yes [provider]  SUMATRIPTAN SUCCINATE IJ Inject as directed daily as needed (migraine headache).   Yes [provider]  TAMSULOSIN HCL PO Take 1 capsule by mouth 2 (two) times daily.   Yes [provider]  Thiamine HCl (THIAMINE PO) Take 1 tablet by mouth daily.   Yes [provider]    Scheduled Meds: . [START ON 10/30/2017] pantoprazole  40 mg Intravenous Q12H  . sodium chloride flush  3 mL Intravenous Q12H  . sucralfate  1 g Oral Once   Continuous Infusions: . pantoprozole (PROTONIX) infusion 8 mg/hr (10/27/17 2313)   PRN Meds:.acetaminophen **OR** acetaminophen, fentaNYL (SUBLIMAZE) injection, hydrALAZINE, LORazepam, ondansetron **OR** ondansetron (ZOFRAN) IV,  phenol, polyvinyl alcohol  Allergies as of 10/26/2017  . (No Known Allergies)    Family History  Problem Relation Age of Onset  . Obesity Daughter     Social History   Socioeconomic History  . Marital status: Married    Spouse name: Not on file  . Number of children: Not on file  . Years of education:  Not on file  . Highest education level: Not on file  Social Needs  . Financial resource strain: Not on file  . Food insecurity - worry: Not on file  . Food insecurity - inability: Not on file  . Transportation needs - medical: Not on file  . Transportation needs - non-medical: Not on file  Occupational History  . Not on file  Tobacco Use  . Smoking status: Former Smoker    Types: Cigarettes  . Smokeless tobacco: Never Used  Substance and Sexual Activity  . Alcohol use: No    Frequency: Never  . Drug use: No  . Sexual activity: Not on file  Other Topics Concern  . Not on file  Social History Narrative  . Not on file    Review of Systems: Review of Systems  Constitutional: Positive for malaise/fatigue. Negative for chills and fever.  HENT: Negative for hearing loss and tinnitus.   Eyes: Negative for blurred vision and double vision.  Respiratory: Negative for cough, hemoptysis and sputum production.   Cardiovascular: Negative for chest pain and palpitations.  Gastrointestinal: Positive for abdominal pain, heartburn, nausea and vomiting. Negative for blood in stool, constipation, diarrhea and melena.  Genitourinary: Negative for dysuria and urgency.  Musculoskeletal: Negative for myalgias and neck pain.  Skin: Negative for itching and rash.  Neurological: Positive for headaches. Negative for focal weakness and seizures.  Endo/Heme/Allergies: Does not bruise/bleed easily.  Psychiatric/Behavioral: Negative for hallucinations and suicidal ideas.    Physical Exam: Vital signs: Vitals:   10/28/17 0623 10/28/17 0813  BP: (!) 169/87 (!) 166/85  Pulse: 85 79  Resp:  18  Temp:  98.2 F (36.8 C)  SpO2:  92%   Last BM Date: 10/25/17 Physical Exam  Constitutional: He is oriented to person, place, and time. He appears well-developed. He appears distressed.  In mild distress from NG tube.  HENT:  Head: Normocephalic and atraumatic.  NG tube in place  Eyes: EOM are normal. No  scleral icterus.  Neck: Normal range of motion. Neck supple. No thyromegaly present.  Cardiovascular: Normal rate, regular rhythm and normal heart sounds.  Pulmonary/Chest: Effort normal and breath sounds normal. No respiratory distress.  Abdominal: Soft. Bowel sounds are normal. He exhibits no distension. There is no tenderness. There is no guarding.  Musculoskeletal: Normal range of motion. He exhibits no edema.  Neurological: He is alert and oriented to person, place, and time.  Skin: Skin is warm. No erythema.  Psychiatric: He has a normal mood and affect. Judgment and thought content normal.  Vitals reviewed.   GI:  Lab Results: Recent Labs    10/26/17 1450  10/27/17 0931 10/27/17 1804 10/28/17 0140  WBC 15.4*  --   --   --   --   HGB 15.5   < > 13.3 13.6 12.9*  HCT 46.6   < > 41.5 42.6 40.8  PLT 338  --   --   --   --    < > = values in this interval not displayed.   BMET Recent Labs    10/26/17 1450 10/27/17 0135  NA  141 141  K 3.7 4.1  CL 98* 102  CO2 32 29  GLUCOSE 146* 172*  BUN 17 15  CREATININE 1.33* 1.30*  CALCIUM 10.8* 9.7   LFT Recent Labs    10/26/17 1643  PROT 8.1  ALBUMIN 4.6  AST 23  ALT 25  ALKPHOS 52  BILITOT 0.7  BILIDIR <0.1*  IBILI NOT CALCULATED   PT/INR No results for input(s): LABPROT, INR in the last 72 hours.   Studies/Results: Dg Chest 2 View  Result Date: 10/26/2017 CLINICAL DATA:  Chest pain. EXAM: CHEST  2 VIEW COMPARISON:  Two-view chest x-ray 11/06/2007 FINDINGS: The heart size and mediastinal contours are within normal limits. Both lungs are clear. The visualized skeletal structures are unremarkable. IMPRESSION: Negative two view chest x-ray Electronically Signed   By: Marin Robertshristopher  Mattern M.D.   On: 10/26/2017 15:22   Ct Abdomen Pelvis W Contrast  Result Date: 10/26/2017 CLINICAL DATA:  Dark colored vomiting.  Hematuria 2 weeks ago. EXAM: CT ABDOMEN AND PELVIS WITH CONTRAST TECHNIQUE: Multidetector CT imaging of the  abdomen and pelvis was performed using the standard protocol following bolus administration of intravenous contrast. CONTRAST:  100mL ISOVUE-300 IOPAMIDOL (ISOVUE-300) INJECTION 61% COMPARISON:  None. FINDINGS: Lower chest: There is a nodule in the right lung on series 4, image 2 adjacent to the fissure measuring 6.7 mm. No other acute abnormalities are identified in the lung bases. Hepatobiliary: Hepatic steatosis. The liver, portal vein, and gallbladder are otherwise normal. Pancreas: Focal rounded fat in the pancreas on series 3, image 47 of no significance. The pancreas is otherwise normal. Spleen: Normal in size without focal abnormality. Adrenals/Urinary Tract: There is a 12 mm nodule in the left adrenal gland on series 3, image 30, too small to characterize. The right adrenal gland is normal. There is a 3.5 cm mass off the upper pole the right kidney with an attenuation of 24 Hounsfield units. No other renal masses. No hydronephrosis or perinephric stranding. No ureterectasis or ureteral stone. The bladder is normal. Stomach/Bowel: The stomach is markedly distended with fluid and debris. The proximal duodenum is normal in caliber. The small bowel is otherwise normal. Colonic diverticulosis is seen without focal diverticulitis. The visualized appendix is normal. Vascular/Lymphatic: Atherosclerotic changes are seen in the nonaneurysmal aorta, iliac vessels common femoral vessels. No adenopathy. Reproductive: Prostate calcifications are identified. The prostate is otherwise normal. Other: There is mild increased attenuation in the fat of the gastrohepatic ligament along the lesser curvature of the stomach. No free air or free fluid. Musculoskeletal: No acute or significant osseous findings. IMPRESSION: 1. There is marked distension of the stomach. There may be mild thickening in the region of the distal antrum and pylorus based on coronal images. The findings are most consistent with gastric outlet obstruction  of unknown etiology. The mild increased attenuation in the fat of the gastrohepatic ligament could be due to inflammation or venous congestion given the gastric distention. 2. There is a mass in the upper pole the right kidney which is not definitely cystic based on today's study. I suspect this is a complicated cyst but a solid mass is not excluded. Recommend ultrasound for further evaluation. If the mass is not definitively cystic on ultrasound, recommend MRI. 3. 6.7 mm nodule in the right lung base. Non-contrast chest CT at 6-12 months is recommended. If the nodule is stable at time of repeat CT, then future CT at 18-24 months (from today's scan) is considered optional for low-risk patients, but is recommended  for high-risk patients. This recommendation follows the consensus statement: Guidelines for Management of Incidental Pulmonary Nodules Detected on CT Images: From the Fleischner Society 2017; Radiology 2017; 284:228-243. 4. 12 mm nodule in the left adrenal gland is too small to characterize. If the patient does not have a known malignancy, recommend a 12 month follow-up CT scan to ensure stability. 5. Atherosclerotic change in the abdominal aorta. Electronically Signed   By: Gerome Samavid  Williams III M.D   On: 10/26/2017 21:45   [place] Renal  Result Date: 10/27/2017 CLINICAL DATA:  71 year old male with right renal lesion seen on earlier CT. EXAM: RENAL / URINARY TRACT ULTRASOUND COMPLETE COMPARISON:  Abdominal CT dated 10/26/2017 FINDINGS: Evaluation is limited due to patient's body habitus. Right Kidney: Length: 11.4 cm. There is a 3.2 x 3.9 x 3.4 cm hypoechoic lesion in the upper pole of the right kidney which is indeterminate and not well characterized. This corresponds to the low attenuating lesion seen on the earlier CT. No hydronephrosis or shadowing stone. Left Kidney: Length: 12.2 cm. Normal echogenicity. No hydronephrosis or shadowing stone. Bladder: Appears normal for degree of bladder distention.  Incidental note of moderate to severe fatty liver. IMPRESSION: Hypoechoic lesion in the upper pole of the right kidney is not well characterized on this ultrasound. Further characterization with MRI as previously recommended advised. Fatty liver. Electronically Signed   By: Elgie CollardArash  Radparvar M.D.   On: 10/27/2017 03:41   Dg Abd Portable 1v  Result Date: 10/28/2017 CLINICAL DATA:  Enteric tube placement. EXAM: PORTABLE ABDOMEN - 1 VIEW COMPARISON:  10/28/2017 abdomen radiograph FINDINGS: Normal bowel gas pattern. Enteric tube tip projects over gastric body. No radiopaque urinary stone identified. Bones are unremarkable. IMPRESSION: Enteric tube tip projects over gastric body. Electronically Signed   By: Mitzi HansenLance  Furusawa-Stratton M.D.   On: 10/28/2017 04:21   Dg Abd Portable 1v  Result Date: 10/28/2017 CLINICAL DATA:  71 y/o  M; nasogastric tube placement. EXAM: PORTABLE ABDOMEN - 1 VIEW COMPARISON:  None. FINDINGS: Enteric tube projects over proximal stomach, suggest advancement. Normal bowel gas pattern. Bones are unremarkable. IMPRESSION: Enteric tube tip projects over proximal stomach, suggest advancement. Electronically Signed   By: Mitzi HansenLance  Furusawa-Stratton M.D.   On: 10/28/2017 02:14    Impression/Plan: - Gastric outlet obstruction. Most likely from pyloric channel stenosis. - Coffee-ground emesis. From # 1  - History of peptic ulcer disease. - NSAID use  Recommendations --------------------------- - Patient's physical exam is benign today. No epigastric pain. No significant gastric distention. Continue NG suction for now. Plan for EGD tomorrow. - Patient was advised to avoid NSAIDs. - He also needs to be on lifelong PPI given his history of recurrent peptic ulcer disease and ongoing use of NSAIDs. - Further plan based on endoscopic finding tomorrow.   LOS: 1 day   Kathi DerParag Judithe Keetch  MD, FACP 10/28/2017, 8:34 AM  Contact #  [phone]

## 2017-10-28 NOTE — Progress Notes (Signed)
Subjective/Chief Complaint: No complaints Denies abdominal pain   Objective: Vital signs in last 24 hours: Temp:  [97.8 F (36.6 C)-98.6 F (37 C)] 98.6 F (37 C) (12/10 0250) Pulse Rate:  [81-97] 85 (12/10 0623) Resp:  [18] 18 (12/10 0250) BP: (169-198)/(87-99) 169/87 (12/10 0623) SpO2:  [92 %-94 %] 94 % (12/10 0250) Last BM Date: 10/25/17  Intake/Output from previous day: 12/09 0701 - 12/10 0700 In: 1944.2 [I.V.:1944.2] Out: 3850 [Urine:800; Emesis/NG output:3050] Intake/Output this shift: No intake/output data recorded.  Exam: Abdomen soft, non-tender, non-distended  Lab Results:  Recent Labs    10/26/17 1450  10/27/17 1804 10/28/17 0140  WBC 15.4*  --   --   --   HGB 15.5   < > 13.6 12.9*  HCT 46.6   < > 42.6 40.8  PLT 338  --   --   --    < > = values in this interval not displayed.   BMET Recent Labs    10/26/17 1450 10/27/17 0135  NA 141 141  K 3.7 4.1  CL 98* 102  CO2 32 29  GLUCOSE 146* 172*  BUN 17 15  CREATININE 1.33* 1.30*  CALCIUM 10.8* 9.7   PT/INR No results for input(s): LABPROT, INR in the last 72 hours. ABG No results for input(s): PHART, HCO3 in the last 72 hours.  Invalid input(s): PCO2, PO2  Studies/Results: Dg Chest 2 View  Result Date: 10/26/2017 CLINICAL DATA:  Chest pain. EXAM: CHEST  2 VIEW COMPARISON:  Two-view chest x-ray 11/06/2007 FINDINGS: The heart size and mediastinal contours are within normal limits. Both lungs are clear. The visualized skeletal structures are unremarkable. IMPRESSION: Negative two view chest x-ray Electronically Signed   By: Marin Robertshristopher  Mattern M.D.   On: 10/26/2017 15:22   Ct Abdomen Pelvis W Contrast  Result Date: 10/26/2017 CLINICAL DATA:  Dark colored vomiting.  Hematuria 2 weeks ago. EXAM: CT ABDOMEN AND PELVIS WITH CONTRAST TECHNIQUE: Multidetector CT imaging of the abdomen and pelvis was performed using the standard protocol following bolus administration of intravenous contrast.  CONTRAST:  100mL ISOVUE-300 IOPAMIDOL (ISOVUE-300) INJECTION 61% COMPARISON:  None. FINDINGS: Lower chest: There is a nodule in the right lung on series 4, image 2 adjacent to the fissure measuring 6.7 mm. No other acute abnormalities are identified in the lung bases. Hepatobiliary: Hepatic steatosis. The liver, portal vein, and gallbladder are otherwise normal. Pancreas: Focal rounded fat in the pancreas on series 3, image 47 of no significance. The pancreas is otherwise normal. Spleen: Normal in size without focal abnormality. Adrenals/Urinary Tract: There is a 12 mm nodule in the left adrenal gland on series 3, image 30, too small to characterize. The right adrenal gland is normal. There is a 3.5 cm mass off the upper pole the right kidney with an attenuation of 24 Hounsfield units. No other renal masses. No hydronephrosis or perinephric stranding. No ureterectasis or ureteral stone. The bladder is normal. Stomach/Bowel: The stomach is markedly distended with fluid and debris. The proximal duodenum is normal in caliber. The small bowel is otherwise normal. Colonic diverticulosis is seen without focal diverticulitis. The visualized appendix is normal. Vascular/Lymphatic: Atherosclerotic changes are seen in the nonaneurysmal aorta, iliac vessels common femoral vessels. No adenopathy. Reproductive: Prostate calcifications are identified. The prostate is otherwise normal. Other: There is mild increased attenuation in the fat of the gastrohepatic ligament along the lesser curvature of the stomach. No free air or free fluid. Musculoskeletal: No acute or significant osseous findings. IMPRESSION:  1. There is marked distension of the stomach. There may be mild thickening in the region of the distal antrum and pylorus based on coronal images. The findings are most consistent with gastric outlet obstruction of unknown etiology. The mild increased attenuation in the fat of the gastrohepatic ligament could be due to  inflammation or venous congestion given the gastric distention. 2. There is a mass in the upper pole the right kidney which is not definitely cystic based on today's study. I suspect this is a complicated cyst but a solid mass is not excluded. Recommend ultrasound for further evaluation. If the mass is not definitively cystic on ultrasound, recommend MRI. 3. 6.7 mm nodule in the right lung base. Non-contrast chest CT at 6-12 months is recommended. If the nodule is stable at time of repeat CT, then future CT at 18-24 months (from today's scan) is considered optional for low-risk patients, but is recommended for high-risk patients. This recommendation follows the consensus statement: Guidelines for Management of Incidental Pulmonary Nodules Detected on CT Images: From the Fleischner Society 2017; Radiology 2017; 284:228-243. 4. 12 mm nodule in the left adrenal gland is too small to characterize. If the patient does not have a known malignancy, recommend a 12 month follow-up CT scan to ensure stability. 5. Atherosclerotic change in the abdominal aorta. Electronically Signed   By: Gerome Sam III M.D   On: 10/26/2017 21:45   US Renal  Result Date: 10/27/2017 CLINICAL DATA:  71 year old male with right renal lesion seen on earlier CT. EXAM: RENAL / URINARY TRACT ULTRASOUND COMPLETE COMPARISON:  Abdominal CT dated 10/26/2017 FINDINGS: Evaluation is limited due to patient's body habitus. Right Kidney: Length: 11.4 cm. There is a 3.2 x 3.9 x 3.4 cm hypoechoic lesion in the upper pole of the right kidney which is indeterminate and not well characterized. This corresponds to the low attenuating lesion seen on the earlier CT. No hydronephrosis or shadowing stone. Left Kidney: Length: 12.2 cm. Normal echogenicity. No hydronephrosis or shadowing stone. Bladder: Appears normal for degree of bladder distention. Incidental note of moderate to severe fatty liver. IMPRESSION: Hypoechoic lesion in the upper pole of the right  kidney is not well characterized on this ultrasound. Further characterization with MRI as previously recommended advised. Fatty liver. Electronically Signed   By: Elgie Collard M.D.   On: 10/27/2017 03:41   Dg Abd Portable 1v  Result Date: 10/28/2017 CLINICAL DATA:  Enteric tube placement. EXAM: PORTABLE ABDOMEN - 1 VIEW COMPARISON:  10/28/2017 abdomen radiograph FINDINGS: Normal bowel gas pattern. Enteric tube tip projects over gastric body. No radiopaque urinary stone identified. Bones are unremarkable. IMPRESSION: Enteric tube tip projects over gastric body. Electronically Signed   By: Mitzi Hansen M.D.   On: 10/28/2017 04:21   Dg Abd Portable 1v  Result Date: 10/28/2017 CLINICAL DATA:  71 y/o  M; nasogastric tube placement. EXAM: PORTABLE ABDOMEN - 1 VIEW COMPARISON:  None. FINDINGS: Enteric tube projects over proximal stomach, suggest advancement. Normal bowel gas pattern. Bones are unremarkable. IMPRESSION: Enteric tube tip projects over proximal stomach, suggest advancement. Electronically Signed   By: Mitzi Hansen M.D.   On: 10/28/2017 02:14    Anti-infectives: Anti-infectives (From admission, onward)   None      Assessment/Plan:  Gastric outlet obstruction  Continuing NG and n.p.o. NG has put out 3 L over the past day. Gastroenterology is planning an endoscopy this week.  Surgical plans will be based on findings at endoscopy.   LOS: 1 day  Matisha Termine A 10/28/2017

## 2017-10-28 NOTE — Progress Notes (Signed)
PROGRESS NOTE    Arthur Cook  WUJ:[id number] DOB: [birth year] DOA: 10/26/2017 PCP: [facility]   Specialists:     Brief Narrative:  3273 ? Severe PUD with Channel ulcer hemetesus   Assessment & Plan:   Principal Problem:   Gastric outlet obstruction Active Problems:   History of stomach ulcers   Coffee ground emesis   Hypertension   Kidney disease   Lesion of right native kidney   Gastric outlet obstruction-?  Malignancy?  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 for now Continue NG tube to suction as per GI who have seen Scheduled for 10/11 endoscopy--further follow-up at that time  Renal mass Renal ultrasound = hypo echoic lesion upper right pole poorly characterized Will probably need MRI  Chronic migraines Near daily migraines and only [facility] as cannot get so make as not covered any further Needs to avoid NSAIDs May benefit from comprehensive pain management outpatient  HTN continue lisinopril on discharge depending on creatinine  CKD stage I? Creatinine on admission 1.33 We will repeat labs in a.m.  Insomnia Continue Ativan as a replacement for Seroquel at this time is n.p.o.  Incidental lung nodule right base lung Will need outpatient follow-up imaging    DVT prophylaxis: lovenox Code Status:  full Family Communication: non presetn at exam--patient states will update Disposition Plan: inpatient   Consultants:   Gi  gen surg  Procedures:   Ct scan  US kidney  Antimicrobials:   none    Subjective:  well no issue tol tube Passing some gas Has been seen by specialists--understands will have scope--asks about fixing problem in stom and other options  Objective: Vitals:   10/27/17 2205 10/28/17 0250 10/28/17 0623 10/28/17 0813  BP: (!) 188/97 (!) 171/98 (!) 169/87 (!) 166/85  Pulse: 97 88 85 79  Resp: 18 18  18   Temp: 97.8 F (36.6 C) 98.6 F (37 C)  98.2 F (36.8 C)  TempSrc: Oral Oral   Oral  SpO2: 92% 94%  92%  Weight:      Height:        Intake/Output Summary (Last 24 hours) at 10/28/2017 1031 Last data filed at 10/28/2017 0625 Gross per 24 hour  Intake 981.67 ml  Output 3750 ml  Net -2768.33 ml   Filed Weights   10/26/17 1435  Weight: 108.9 kg (240 lb)    Examination:  eomi ngt cl;amped in nad No pallor no ict cta b abd soft nt nd no rebound neuro intact  Data Reviewed: I have personally reviewed following labs and imaging studies  CBC: Recent Labs  Lab 10/26/17 1450 10/27/17 0135 10/27/17 0931 10/27/17 1804 10/28/17 0140 10/28/17 0920  WBC 15.4*  --   --   --   --   --   HGB 15.5 13.8 13.3 13.6 12.9* 12.7*  HCT 46.6 43.1 41.5 42.6 40.8 40.2  MCV 86.8  --   --   --   --   --   PLT 338  --   --   --   --   --    Basic Metabolic Panel: Recent Labs  Lab 10/26/17 1450 10/27/17 0135  NA 141 141  K 3.7 4.1  CL 98* 102  CO2 32 29  GLUCOSE 146* 172*  BUN 17 15  CREATININE 1.33* 1.30*  CALCIUM 10.8* 9.7   GFR: Estimated Creatinine Clearance: 62.6 mL/min (A) (by C-G formula based on SCr of 1.3 mg/dL (H)). Liver  Function Tests: Recent Labs  Lab 10/26/17 1643  AST 23  ALT 25  ALKPHOS 52  BILITOT 0.7  PROT 8.1  ALBUMIN 4.6   Recent Labs  Lab 10/26/17 1643  LIPASE 48   No results for input(s): AMMONIA in the last 168 hours. Coagulation Profile: No results for input(s): INR, PROTIME in the last 168 hours. Cardiac Enzymes: No results for input(s): CKTOTAL, CKMB, CKMBINDEX, TROPONINI in the last 168 hours. BNP (last 3 results) No results for input(s): PROBNP in the last 8760 hours. HbA1C: No results for input(s): HGBA1C in the last 72 hours. CBG: Recent Labs  Lab 10/27/17 0823 10/28/17 0734  GLUCAP 138* 126*   Lipid Profile: No results for input(s): CHOL, HDL, LDLCALC, TRIG, CHOLHDL, LDLDIRECT in the last 72 hours. Thyroid Function Tests: No results for input(s): TSH, T4TOTAL, FREET4, T3FREE, THYROIDAB in the last 72  hours. Anemia Panel: No results for input(s): VITAMINB12, FOLATE, FERRITIN, TIBC, IRON, RETICCTPCT in the last 72 hours. Urine analysis: No results found for: COLORURINE, APPEARANCEUR, LABSPEC, PHURINE, GLUCOSEU, HGBUR, BILIRUBINUR, KETONESUR, PROTEINUR, UROBILINOGEN, NITRITE, LEUKOCYTESUR   Radiology Studies: Reviewed images personally in health database    Scheduled Meds: . [START ON 10/30/2017] pantoprazole  40 mg Intravenous Q12H  . sodium chloride flush  3 mL Intravenous Q12H  . sucralfate  1 g Oral Once   Continuous Infusions: . pantoprozole (PROTONIX) infusion 8 mg/hr (10/28/17 0839)     LOS: 1 day    Time spent: 20    [name], MD [facility](P) [phone]   If 7PM-7AM, please contact night-coverage [email or web address] Password [email or web address] 10/28/2017, 10:31 AM

## 2017-10-28 NOTE — Progress Notes (Signed)
Initial Nutrition Assessment  DOCUMENTATION CODES:   Obesity unspecified  INTERVENTION:   -Await diet progression  -Pt may benefit from nutritional supplement once diet advanced, Will assess on follow   NUTRITION DIAGNOSIS:   Inadequate oral intake related to acute illness, inability to eat as evidenced by NPO status.  GOAL:   Patient will meet greater than or equal to 90% of their needs  MONITOR:   Diet advancement, PO intake, Labs, Weight trends, I & O's  REASON FOR ASSESSMENT:   Malnutrition Screening Tool    ASSESSMENT:    71 yo male admitted with epigastric pain, N/V with coffee ground emesis with GOO mostly likely from pyloric channel stenosis. . Pt with hx of pyloric stenosis with recurrent PUD, HTN, renal disorder, stomach ulcers    Pt reports appetite was very good up until 3-4 days PTA. NPO currently.  Pt denies weight loss.   EGD planned for tomorrow, surgical plan to follow based on results  NG LIS with >3 L in 24 hours. Denies abd pain, N/V at present  Labs: reviewed Meds: reviewed  NUTRITION - FOCUSED PHYSICAL EXAM:    Most Recent Value  Orbital Region  No depletion  Upper Arm Region  No depletion  Thoracic and Lumbar Region  No depletion  Buccal Region  No depletion  Temple Region  No depletion  Clavicle Bone Region  No depletion  Clavicle and Acromion Bone Region  No depletion  Scapular Bone Region  No depletion  Dorsal Hand  No depletion  Patellar Region  No depletion  Anterior Thigh Region  No depletion  Posterior Calf Region  No depletion  Edema (RD Assessment)  None  Hair  Reviewed  Eyes  Reviewed  Mouth  Reviewed  Skin  Reviewed  Nails  Reviewed       Diet Order:  Diet NPO time specified  EDUCATION NEEDS:   No education needs have been identified at this time  Skin:  Skin Assessment: Reviewed RN Assessment  Last BM:  12/7  Height:   Ht Readings from Last 1 Encounters:  10/26/17 5\' 10"  (1.778 m)    Weight:   Wt  Readings from Last 1 Encounters:  10/26/17 240 lb (108.9 kg)    Ideal Body Weight:  75.5 kg  BMI:  Body mass index is 34.44 kg/m.  Estimated Nutritional Needs:   Kcal:  2200-2500 kcals  Protein:  110-125 g  Fluid:  >/= 2 L   Romelle Starcherate Vonnie Ligman MS, RD, LDN, CNSC [phone] Pager  [phone] Weekend/On-Call Pager

## 2017-10-29 ENCOUNTER — Inpatient Hospital Stay (HOSPITAL_COMMUNITY): Payer: Non-veteran care | Admitting: Certified Registered"

## 2017-10-29 ENCOUNTER — Encounter (HOSPITAL_COMMUNITY)
Admission: EM | Disposition: A | Discharge status: Home / Self Care | Payer: Self-pay | Source: Home / Self Care | Attending: Family Medicine

## 2017-10-29 ENCOUNTER — Encounter (HOSPITAL_COMMUNITY): Payer: Self-pay | Admitting: *Deleted

## 2017-10-29 HISTORY — PX: ESOPHAGOGASTRODUODENOSCOPY (EGD) WITH PROPOFOL: SHX5813

## 2017-10-29 LAB — HEMOGLOBIN AND HEMATOCRIT, BLOOD
HCT: 42.9 % (ref 39.0–52.0)
HEMATOCRIT: 41.7 % (ref 39.0–52.0)
HEMATOCRIT: 41.3 % (ref 39.0–52.0)
HEMOGLOBIN: 13.5 g/dL (ref 13.0–17.0)
HEMOGLOBIN: 13.4 g/dL (ref 13.0–17.0)
Hemoglobin: 13.8 g/dL (ref 13.0–17.0)

## 2017-10-29 LAB — GLUCOSE, CAPILLARY: GLUCOSE-CAPILLARY: 119 mg/dL — AB (ref 65–99)

## 2017-10-29 SURGERY — ESOPHAGOGASTRODUODENOSCOPY (EGD) WITH PROPOFOL
Anesthesia: Monitor Anesthesia Care

## 2017-10-29 MED ORDER — SUMATRIPTAN 20 MG/ACT NA SOLN
20.0000 mg | NASAL | Status: DC | PRN
  Administered 2017-10-29 – 2017-10-30 (×3): 20 mg via NASAL
  Filled 2017-10-29 (×5): qty 1

## 2017-10-29 MED ORDER — PROPOFOL 10 MG/ML IV BOLUS
INTRAVENOUS | Status: DC | PRN
  Administered 2017-10-29: 10 mg via INTRAVENOUS
  Administered 2017-10-29 (×4): 20 mg via INTRAVENOUS

## 2017-10-29 MED ORDER — SERTRALINE HCL 50 MG PO TABS
50.0000 mg | ORAL_TABLET | Freq: Every day | ORAL | Status: DC
  Administered 2017-10-29 – 2017-10-30 (×2): 50 mg via ORAL
  Filled 2017-10-29 (×2): qty 1

## 2017-10-29 MED ORDER — BUTAMBEN-TETRACAINE-BENZOCAINE 2-2-14 % EX AERO
INHALATION_SPRAY | CUTANEOUS | Status: DC | PRN
  Administered 2017-10-29: 2 via TOPICAL

## 2017-10-29 MED ORDER — PROPOFOL 500 MG/50ML IV EMUL
INTRAVENOUS | Status: DC | PRN
  Administered 2017-10-29: 75 ug/kg/min via INTRAVENOUS

## 2017-10-29 MED ORDER — SODIUM CHLORIDE 0.9 % IV SOLN: INTRAVENOUS | Status: DC

## 2017-10-29 MED ORDER — HYDROCODONE-ACETAMINOPHEN 5-325 MG PO TABS
1.0000 | ORAL_TABLET | Freq: Three times a day (TID) | ORAL | Status: DC | PRN
  Administered 2017-10-29 (×2): 1 via ORAL
  Filled 2017-10-29 (×2): qty 1

## 2017-10-29 MED ORDER — LISINOPRIL 5 MG PO TABS
5.0000 mg | ORAL_TABLET | Freq: Every day | ORAL | Status: DC
  Administered 2017-10-29: 5 mg via ORAL
  Filled 2017-10-29 (×2): qty 1

## 2017-10-29 MED ORDER — QUETIAPINE FUMARATE ER 50 MG PO TB24
50.0000 mg | ORAL_TABLET | Freq: Every day | ORAL | Status: DC
  Administered 2017-10-29 – 2017-10-30 (×2): 50 mg via ORAL
  Filled 2017-10-29 (×2): qty 1

## 2017-10-29 MED ORDER — CARBAMAZEPINE 200 MG PO TABS
100.0000 mg | ORAL_TABLET | Freq: Two times a day (BID) | ORAL | Status: DC
  Administered 2017-10-29 – 2017-10-30 (×3): 100 mg via ORAL
  Filled 2017-10-29: qty 1
  Filled 2017-10-29: qty 0.5
  Filled 2017-10-29 (×2): qty 1
  Filled 2017-10-29 (×2): qty 0.5

## 2017-10-29 MED ORDER — GABAPENTIN 300 MG PO CAPS
300.0000 mg | ORAL_CAPSULE | Freq: Every day | ORAL | Status: DC
  Administered 2017-10-29 – 2017-10-30 (×2): 300 mg via ORAL
  Filled 2017-10-29 (×2): qty 1

## 2017-10-29 MED ORDER — TAMSULOSIN HCL 0.4 MG PO CAPS
0.4000 mg | ORAL_CAPSULE | Freq: Every day | ORAL | Status: DC
  Administered 2017-10-29 – 2017-10-30 (×2): 0.4 mg via ORAL
  Filled 2017-10-29 (×2): qty 1

## 2017-10-29 MED ORDER — LACTATED RINGERS IV SOLN
INTRAVENOUS | Status: DC
  Administered 2017-10-29: 1000 mL via INTRAVENOUS

## 2017-10-29 SURGICAL SUPPLY — 15 items

## 2017-10-29 NOTE — Anesthesia Preprocedure Evaluation (Addendum)
Anesthesia Evaluation  Patient identified by MRN, date of birth, ID band Patient awake    Reviewed: Allergy & Precautions, NPO status , Patient's Chart, lab work & pertinent test results  Airway Mallampati: II  TM Distance: >3 FB Neck ROM: Full    Dental  (+) Edentulous Upper, Dental Advisory Given   Pulmonary former smoker,    breath sounds clear to auscultation       Cardiovascular hypertension,  Rhythm:Regular Rate:Normal     Neuro/Psych negative neurological ROS     GI/Hepatic negative GI ROS, Neg liver ROS,   Endo/Other  negative endocrine ROS  Renal/GU Renal InsufficiencyRenal disease     Musculoskeletal negative musculoskeletal ROS (+)   Abdominal   Peds  Hematology negative hematology ROS (+)   Anesthesia Other Findings   Reproductive/Obstetrics                            Anesthesia Physical Anesthesia Plan  ASA: III  Anesthesia Plan: MAC   Post-op Pain Management:    Induction: Intravenous  PONV Risk Score and Plan: 1 and Treatment may vary due to age or medical condition  Airway Management Planned: Natural Airway  Additional Equipment:   Intra-op Plan:   Post-operative Plan:   Informed Consent: I have reviewed the patients History and Physical, chart, labs and discussed the procedure including the risks, benefits and alternatives for the proposed anesthesia with the patient or authorized representative who has indicated his/her understanding and acceptance.     Plan Discussed with:   Anesthesia Plan Comments:         Anesthesia Quick Evaluation

## 2017-10-29 NOTE — Anesthesia Postprocedure Evaluation (Signed)
Anesthesia Post Note  Patient: Arthur Cook  Procedure(s) Performed: ESOPHAGOGASTRODUODENOSCOPY (EGD) WITH PROPOFOL (N/A )     Patient location during evaluation: Endoscopy Anesthesia Type: MAC Level of consciousness: awake and alert Pain management: pain level controlled Vital Signs Assessment: post-procedure vital signs reviewed and stable Respiratory status: spontaneous breathing, nonlabored ventilation, respiratory function stable and patient connected to nasal cannula oxygen Cardiovascular status: stable and blood pressure returned to baseline Postop Assessment: no apparent nausea or vomiting Anesthetic complications: no    Last Vitals:  Vitals:   10/29/17 1331 10/29/17 1346  BP:  (!) 172/95  Pulse: 88 83  Resp: (!) 23 18  Temp:  36.8 C  SpO2: 97% 95%    Last Pain:  Vitals:   10/29/17 1346  TempSrc: Oral  PainSc:                  [name],JAMES TERRILL

## 2017-10-29 NOTE — Progress Notes (Signed)
Patient ID: Arthur Cook, male   DOB: Mar 15, 1946, 71 y.o.   MRN: [id number]   Patient had successful dilation of pyloric stricture from chronic NSAID use.  Doing well now with no pain. Clear liquids started. Hopefully home tomorrow.   He still may need surgery in the future. We will see prn. I discussed this with him and his family

## 2017-10-29 NOTE — Progress Notes (Signed)
PROGRESS NOTE    Arthur Cook  ZOX:[id number] DOB: 20-Jan-1946 DOA: 10/26/2017 PCP: [facility]   Specialists:     Brief Narrative:   5673 ? Severe PUD with Channel ulcer hemetesus   Assessment & Plan:   Principal Problem:   Gastric outlet obstruction Active Problems:   History of stomach ulcers   Coffee ground emesis   Hypertension   Kidney disease   Lesion of right native kidney   Gastric outlet obstruction-  Internal stricture on EGDdilated by Dr. Otto HerbBrahmbarat 12/11 --no need for biopsy low risk of malignancy To go home am after tol diet properly per GI CLD for  Now out  Renal mass Renal ultrasound = hypo echoic lesion upper right pole poorly characterized Will probably need MRI--Family comfortable with [facility] in 1-3 weeks as per their protocols  Chronic migraines Near daily migraines and only allowed 10 injections by [facility]  Starting Zomig nasal spray-D/c with po meds as does need meds covered by [facility] Needs to avoid NSAIDs May benefit from comprehensive pain management outpatient\ Resumed on d/c gabapentin 100 bid  HTN  resumed lisinopril 5  CKD stage I? Creatinine on admission 1.33 We will repeat labs 12/10  Insomnia Resuming home meds  Incidental lung nodule right base lung Will need outpatient follow-up imaging    DVT prophylaxis: lovenox Code Status:  full Family Communication: non presetn at exam--patient states will update Disposition Plan: inpatient   Consultants:   Gi  gen surg  Procedures:   Ct scan  US kidney  Antimicrobials:   none    Subjective:  tol diet well--3 cups of jell-o  No fever nor cp Family in room No dark emesis  Objective: Vitals:   10/29/17 1320 10/29/17 1325 10/29/17 1331 10/29/17 1346  BP:  (!) 173/96  (!) 172/95  Pulse: 79 84 88 83  Resp: 18 (!) 24 (!) 23 18  Temp:    98.2 F (36.8 C)  TempSrc:    Oral  SpO2: 96% 96% 97% 95%  Weight:      Height:         Intake/Output Summary (Last 24 hours) at 10/29/2017 1354 Last data filed at 10/29/2017 0601 Gross per 24 hour  Intake 775 ml  Output 995 ml  Net -220 ml   Filed Weights   10/26/17 1435 10/28/17 2023  Weight: 108.9 kg (240 lb) 101.3 kg (223 lb 6.4 oz)    Examination:  eomi  Awake alert No pallor no ict cta b abd soft nt nd no rebound neuro intact  Data Reviewed: I have personally reviewed following labs and imaging studies  CBC: Recent Labs  Lab 10/26/17 1450  10/28/17 0140 10/28/17 0920 10/28/17 1806 10/29/17 0147 10/29/17 0926  WBC 15.4*  --   --   --   --   --   --   HGB 15.5   < > 12.9* 12.7* 13.1 13.8 13.5  HCT 46.6   < > 40.8 40.2 41.1 42.9 41.7  MCV 86.8  --   --   --   --   --   --   PLT 338  --   --   --   --   --   --    < > = values in this interval not displayed.   Basic Metabolic Panel: Recent Labs  Lab 10/26/17 1450 10/27/17 0135  NA 141 141  K 3.7 4.1  CL 98* 102  CO2 32 29  GLUCOSE 146* 172*  BUN 17 15  CREATININE 1.33* 1.30*  CALCIUM 10.8* 9.7   GFR: Estimated Creatinine Clearance: 60.3 mL/min (A) (by C-G formula based on SCr of 1.3 mg/dL (H)). Liver Function Tests: Recent Labs  Lab 10/26/17 1643  AST 23  ALT 25  ALKPHOS 52  BILITOT 0.7  PROT 8.1  ALBUMIN 4.6   Recent Labs  Lab 10/26/17 1643  LIPASE 48   No results for input(s): AMMONIA in the last 168 hours. Coagulation Profile: No results for input(s): INR, PROTIME in the last 168 hours. Cardiac Enzymes: No results for input(s): CKTOTAL, CKMB, CKMBINDEX, TROPONINI in the last 168 hours. BNP (last 3 results) No results for input(s): PROBNP in the last 8760 hours. HbA1C: No results for input(s): HGBA1C in the last 72 hours. CBG: Recent Labs  Lab 10/27/17 0823 10/28/17 0734 10/29/17 0731  GLUCAP 138* 126* 119*   Lipid Profile: No results for input(s): CHOL, HDL, LDLCALC, TRIG, CHOLHDL, LDLDIRECT in the last 72 hours. Thyroid Function Tests: No results for  input(s): TSH, T4TOTAL, FREET4, T3FREE, THYROIDAB in the last 72 hours. Anemia Panel: No results for input(s): VITAMINB12, FOLATE, FERRITIN, TIBC, IRON, RETICCTPCT in the last 72 hours. Urine analysis: No results found for: COLORURINE, APPEARANCEUR, LABSPEC, PHURINE, GLUCOSEU, HGBUR, BILIRUBINUR, KETONESUR, PROTEINUR, UROBILINOGEN, NITRITE, LEUKOCYTESUR   Radiology Studies: Reviewed images personally in health database    Scheduled Meds: . [START ON 10/30/2017] pantoprazole  40 mg Intravenous Q12H  . sodium chloride flush  3 mL Intravenous Q12H   Continuous Infusions:    LOS: 2 days    Time spent: 20    Pleas KochJai Jame Morrell, MD [facility] [phone]   If 7PM-7AM, please contact night-coverage [email or web address] Password [email or web address] 10/29/2017, 1:54 PM

## 2017-10-29 NOTE — Interval H&P Note (Signed)
History and Physical Interval Note:  10/29/2017 12:32 PM  Arthur Cook  has presented today for EGD with the diagnosis of Gastric outlet obstruction, coffee-ground emesis  The various methods of treatment have been discussed with the patient and family. After consideration of risks, benefits and other options for treatment, the patient has consented to  Procedure(s): ESOPHAGOGASTRODUODENOSCOPY (EGD) WITH PROPOFOL (N/A) as a surgical intervention .  The patient's history has been reviewed, patient examined, no change in status, stable for surgery.  I have reviewed the patient's chart and labs.  Questions were answered to the patient's satisfaction.    Risks (bleeding, infection, bowel perforation that could require surgery, sedation-related changes in cardiopulmonary systems), benefits (identification and possible treatment of source of symptoms, exclusion of certain causes of symptoms), and alternatives (watchful waiting, radiographic imaging studies, empiric medical treatment)  were explained to patient and family in detail and patient wishes to proceed.   Karmela Bram

## 2017-10-29 NOTE — Transfer of Care (Signed)
Immediate Anesthesia Transfer of Care Note  Patient: Arthur Cook  Procedure(s) Performed: ESOPHAGOGASTRODUODENOSCOPY (EGD) WITH PROPOFOL (N/A )  Patient Location: Endoscopy Unit  Anesthesia Type:MAC  Level of Consciousness: drowsy  Airway & Oxygen Therapy: Patient Spontanous Breathing and Patient connected to nasal cannula oxygen  Post-op Assessment: Report given to RN  Post vital signs: Reviewed and stable  Last Vitals:  Vitals:   10/29/17 1127 10/29/17 1131  BP:  (!) 195/104  Pulse:    Resp: 16   Temp: 36.7 C   SpO2: 94%     Last Pain:  Vitals:   10/29/17 1127  TempSrc: Oral  PainSc:       Patients Stated Pain Goal: 0 (79/39/03 0092)  Complications: No apparent anesthesia complications

## 2017-10-29 NOTE — Anesthesia Procedure Notes (Signed)
Procedure Name: MAC Date/Time: 10/29/2017 12:34 PM Performed by: Barrington Ellison, CRNA Pre-anesthesia Checklist: Patient identified, Emergency Drugs available, Suction available, Patient being monitored and Timeout performed Patient Re-evaluated:Patient Re-evaluated prior to induction Oxygen Delivery Method: Nasal cannula

## 2017-10-29 NOTE — Brief Op Note (Signed)
10/26/2017 - 10/29/2017  2:16 PM  PATIENT:  Arthur Cook  71 y.o. male  PRE-OPERATIVE DIAGNOSIS:  Gastric outlet obstruction, coffee-ground emesis  POST-OPERATIVE DIAGNOSIS:  pyloric stenosis and ulcer  PROCEDURE:  Procedure(s): ESOPHAGOGASTRODUODENOSCOPY (EGD) WITH PROPOFOL (N/A)  SURGEON:  Surgeon(s) and Role:    * Jaidyn Kuhl, MD - Primary  Findings ------------ - EGD showed severe pyloric stenosis with clean-based ulcer. I was not able to advance adult endoscope. Subsequently pediatric endoscope was used. Duodenal bulb and D2 appeared normal with mild inflammation. - Dilatation with 6-7-8 and 06-27-09 millimeter pyloric balloon was performed. After dilatation, I was able to advance adult endoscope to D2. - EGD also showed LA grade D esophagitis  Recommendations ---------------------- - Continue Protonix for now. - He will need twice a day PPI for next 6-8 weeks - Clear liquid diet for now, advance to full liquid as tolerated. - May need repeat endoscopy in next 2-4 weeks. - Findings and recommendations discussed with patient and family. - Absolute NSAID avoidance discussed. - GI will follow  Kathi DerParag Cordia Miklos MD, FACP 10/29/2017, 2:19 PM  Contact #  [phone]

## 2017-10-29 NOTE — Care Management Note (Signed)
Case Management Note  Patient Details  Name: Arthur Cook MRN: [id number] Date of Birth: 1946-10-19  Subjective/Objective:    CM following for progression and d/c planning.                 Action/Plan: 10/29/2017 Met with pt and family per their request. They are concerned that pt hospital charges be sent to the [facility] per the pt wishes rather than medicare. This CM aware that this family was given info this morning re a contact in finance to discuss this issue this morning. After discussion with family this CM learned that they had not called the number as directed. This CM explained in detail that neither CM or CSW can change pt payor source information and family was encouraged to call the number provided this am to discuss this issue.   Expected Discharge Date:    10/29/2017              Expected Discharge Plan:  Home/Self Care  In-House Referral:  NA  Discharge planning Services  CM Consult, Other - See comment(Pt concerned that his hospital bill  be directed to the [place])  Post Acute Care Choice:  NA Choice offered to:  NA  DME Arranged:  N/A DME Agency:  NA  HH Arranged:  NA HH Agency:  NA  Status of Service:  Completed, signed off  If discussed at [place] of Stay Meetings, dates discussed:    Additional Comments:  Adron Bene, RN 10/29/2017, 3:15 PM

## 2017-10-29 NOTE — Op Note (Addendum)
[facility] Patient Name: Arthur Cook Date : 10/29/2017 MRN: [id number] Attending MD: Kathi DerParag Kate Larock , MD Date of Birth: [birth year] CSN: [id number] Age: [age] Admit Type: Inpatient Procedure:                Upper GI endoscopy Indications:              Abnormal CT of the GI tract Providers:                Kathi DerParag Keylor Rands, MD, Weston SettleBethany Thacker, RN, Margo AyeValeria                            McKoy, Technician Referring MD:              Medicines:                Sedation Administered by an Anesthesia Professional Complications:            Tear, required no intervention Estimated Blood Loss:     Estimated blood loss was minimal. Procedure:                Pre-Anesthesia Assessment:                           - Prior to the procedure, a History and Physical                            was performed, and patient medications and                            allergies were reviewed. The patient's tolerance of                            previous anesthesia was also reviewed. The risks                            and benefits of the procedure and the sedation                            options and risks were discussed with the patient.                            All questions were answered, and informed consent                            was obtained. Prior Anticoagulants: The patient has                            taken no previous anticoagulant or antiplatelet                            agents. ASA Grade Assessment: II - A patient with                            mild systemic disease. After reviewing the risks  and benefits, the patient was deemed in                            satisfactory condition to undergo the procedure.                           After obtaining informed consent, the endoscope was                            passed under direct vision. Throughout the                            procedure, the patient's blood pressure, pulse, and                    oxygen saturations were monitored continuously. The                            EG-[id number] ([id number]) scope was introduced through the                            mouth, and advanced to the second part of duodenum.                            The upper GI endoscopy was technically difficult                            and complex due to stenosis. Successful completion                            of the procedure was aided by withdrawing the scope                            and replacing with the pediatric endoscope. The                            patient tolerated the procedure well. Scope In: Scope Out: Findings:      LA Grade D (one or more mucosal breaks involving at least 75% of       esophageal circumference) esophagitis with no bleeding was found in the       distal esophagus.      A small amount of food (residue) was found in the gastric fundus.      A benign-appearing, intrinsic severe stenosis was found at the pylorus.       This was non-traversed with adult endoscope. Subsequently. Its upper       endoscope was used and I was able to advance it into the D2 without any       significant resistance. Pediatric scope was then withdrawn and adult       endoscope was againin troduced and advanced up to the pyloric channel. A       TTS dilator was passed through the scope. Dilation with a 6-7-8 mm and       an 06-27-09 mm pyloric balloon dilator was performed. The dilation site       was examined following endoscope reinsertion  and showed no       perforation.there was small oozing of blood probably from superficial       tear Estimated blood loss was minimal. After dilatation, I was able to       advance adult endoscope up to the D2.      Diffuse mild inflammation was found in the duodenal bulb.      The first portion of the duodenum and second portion of the duodenum       were normal. Impression:               - LA Grade D erosive esophagitis.                           - A  small amount of food (residue) in the stomach.                           - Gastric stenosis was found at the pylorus.                            Dilated.                           - Duodenitis.                           - Normal first portion of the duodenum and second                            portion of the duodenum.                           - No specimens collected. Moderate Sedation:      Moderate (conscious) sedation was personally administered by an       anesthesia professional. The following parameters were monitored: oxygen       saturation, heart rate, blood pressure, and response to care. Recommendation:           - Return patient to hospital ward for ongoing care.                           - Clear liquid diet.                           - Continue present medications.                           - Repeat upper endoscopy in 2 to 4 weeks for                            retreatment. Procedure Code(s):        --- Professional ---                           [phone], Esophagogastroduodenoscopy, flexible,                            transoral; with dilation of gastric/duodenal  stricture(s) (eg, balloon, bougie) Diagnosis Code(s):        --- Professional ---                           K20.8, Other esophagitis                           K31.1, Adult hypertrophic pyloric stenosis                           K29.80, Duodenitis without bleeding                           R93.3, Abnormal findings on diagnostic imaging of                            other parts of digestive tract CPT copyright 2016 American Medical Association. All rights reserved. The codes documented in this report are preliminary and upon coder review may  be revised to meet current compliance requirements. Kathi DerParag Leana Springston, MD Kathi DerParag Migel Hannis, MD 10/29/2017 1:21:34 PM Number of Addenda: 0

## 2017-10-30 DIAGNOSIS — K311 Adult hypertrophic pyloric stenosis: Principal | ICD-10-CM

## 2017-10-30 LAB — HEMOGLOBIN AND HEMATOCRIT, BLOOD
HCT: 40.6 % (ref 39.0–52.0)
HCT: 41.2 % (ref 39.0–52.0)
Hemoglobin: 13.3 g/dL (ref 13.0–17.0)
Hemoglobin: 13.1 g/dL (ref 13.0–17.0)

## 2017-10-30 LAB — BASIC METABOLIC PANEL
ANION GAP: 8 (ref 5–15)
BUN: 20 mg/dL (ref 6–20)
CALCIUM: 8.6 mg/dL — AB (ref 8.9–10.3)
CO2: 26 mmol/L (ref 22–32)
Chloride: 104 mmol/L (ref 101–111)
Creatinine, Ser: 1 mg/dL (ref 0.61–1.24)
GLUCOSE: 137 mg/dL — AB (ref 65–99)
Potassium: 3.2 mmol/L — ABNORMAL LOW (ref 3.5–5.1)
Sodium: 138 mmol/L (ref 135–145)

## 2017-10-30 LAB — GLUCOSE, CAPILLARY
GLUCOSE-CAPILLARY: 126 mg/dL — AB (ref 65–99)
Glucose-Capillary: 115 mg/dL — ABNORMAL HIGH (ref 65–99)

## 2017-10-30 MED ORDER — LISINOPRIL 20 MG PO TABS
20.0000 mg | ORAL_TABLET | Freq: Every day | ORAL | Status: DC
  Administered 2017-10-30: 20 mg via ORAL
  Filled 2017-10-30: qty 1

## 2017-10-30 MED ORDER — GABAPENTIN 300 MG PO CAPS: 300.0000 mg | ORAL_CAPSULE | Freq: Every day | ORAL | 0 refills | Status: DC

## 2017-10-30 MED ORDER — SERTRALINE HCL 50 MG PO TABS: 50.0000 mg | ORAL_TABLET | Freq: Every day | ORAL | 0 refills | Status: DC

## 2017-10-30 MED ORDER — POTASSIUM CHLORIDE 20 MEQ PO PACK
40.0000 meq | PACK | Freq: Once | ORAL | Status: AC
  Administered 2017-10-30: 40 meq via ORAL
  Filled 2017-10-30: qty 2

## 2017-10-30 MED ORDER — TAMSULOSIN HCL 0.4 MG PO CAPS
0.4000 mg | ORAL_CAPSULE | Freq: Every day | ORAL | 0 refills | Status: DC
Start: 2017-10-31 — End: 2024-02-08

## 2017-10-30 MED ORDER — PANTOPRAZOLE SODIUM 40 MG PO TBEC: 40.0000 mg | DELAYED_RELEASE_TABLET | Freq: Two times a day (BID) | ORAL | Status: DC

## 2017-10-30 MED ORDER — PANTOPRAZOLE SODIUM 40 MG PO TBEC: 40.0000 mg | DELAYED_RELEASE_TABLET | Freq: Two times a day (BID) | ORAL | 0 refills | Status: AC

## 2017-10-30 MED ORDER — ACETAMINOPHEN 325 MG PO TABS: 650.0000 mg | ORAL_TABLET | Freq: Four times a day (QID) | ORAL | 0 refills | Status: DC | PRN

## 2017-10-30 MED ORDER — LISINOPRIL 20 MG PO TABS: 20.0000 mg | ORAL_TABLET | Freq: Two times a day (BID) | ORAL | 0 refills | Status: DC

## 2017-10-30 NOTE — Discharge Summary (Signed)
Physician Discharge Summary      Patient: Arthur Cook                   Admit date: 10/26/2017   DOB: 06-10-1946             Discharge date:10/30/2017/11:21 AM OZH:[id number]                           [facility] Recommendations for Outpatient Follow-up:   1. Please follow-up with gastric neurologist 2. Please follow-up with your primary care physician within 1-2 weeks.  1 Discharge Condition: Stable  CODE STATUS:  Full code  Diet recommendation: Regular healthy diet  ----------------------------------------------------------------------------------------------------------------------  Discharge Diagnoses:   Principal Problem:   Gastric outlet obstruction Active Problems:   History of stomach ulcers   Coffee ground emesis   Hypertension   Kidney disease   Lesion of right native kidney   History of present illness :  Arthur Cook is a 71 y.o. male with medical history significant for hypertension, peptic ulcer disease, and chronic migraines, now presenting to the emergency department for evaluation of pain in the epigastrium and chest with nausea and dark vomitus.  Patient reports that he has previously followed with [facility] and is undergone endoscopy with dilation multiple times.  He has history of PUD and was advised to avoid NSAIDs.  He has been suffering from frequent migraines, has not been prescribed enough sumatriptan to treat all of his headaches, and has therefore resorted to using BC powders and Aleve.  He developed a burning pain in the chest and epigastrium yesterday and also developed nausea with dark brown vomitus yesterday.  The pain, described as burning, constant, localized, and without alleviating or exacerbating factors, has persisted and he is continued to have vomiting.  Denies any lightheadedness, chest pain, or palpitations.  Headache is mild at this time and consistent with his chronic headaches.  No recent fevers or chills and no cough or  dyspnea.  ED Course: Upon arrival to the ED, patient is found to be afebrile, saturating well on room air, and with vitals otherwise stable.  EKG features a sinus rhythm and chest x-ray is negative for acute cardiopulmonary disease.  Chemistry panel reveals a creatinine 1.33 with no recent prior available for comparison.  CBC is notable for a leukocytosis to 15,400 and troponin is undetectable.  CT of the abdomen and pelvis demonstrates marked stomach distention with mild thickening at the distal antrum and pylorus consistent with gastric outlet obstruction of unknown etiology.  Also noted on the CT is a right kidney mass in the upper pole, likely complex cyst, but with ultrasound recommended for further evaluation.  There is also incidental notation of a right lung base nodule on the CT.  Patient was given a liter of normal saline and surgery was consulted by the ED physician.  Surgeon has evaluated the patient in the emergency department and recommended NG tube, bowel rest, IV Protonix, gastroenterology consult for EGD, and renal ultrasound.  Patient was treated with Reglan, morphine, Carafate, and 40 mg IV Protonix.  NG tube was placed and has drained more than 2 L of dark liquid.  Patient has remained hemodynamically stable in the ED, is not in any apparent respiratory distress, and will be admitted to the telemetry unit for ongoing evaluation and management gastric outlet obstruction, likely secondary to PUD, but with malignancy not excluded.  Hospital course /  Brief Summary:  Gastric outlet obstruction-  Internal stricture on EGDdilated by Dr. Otto Herb 12/11 --no need for biopsy low risk of malignancy CLD for  Now out 10/29/2017 - status post EGD yesterday with dilatation of pyloric channel up to 10 mm Tolerating p.o., status post gas and bowel movement Cleared to be discharged home   Renal mass Renal ultrasound - hypo echoic lesion upper right pole poorly characterized Will probably need  MRI--Family comfortable with OP characterization at [facility] in 1-3 weeks as per their protocols  Chronic migraines Near daily migraines and only allowed 10 injections by [facility]  Starting Zomig nasal spray-D/ced  with po meds as does need meds covered by [facility] Needs to avoid NSAIDs  HTN  resumed lisinopril 20 mg Po BID  CKD stage I  Creatinine on admission 1.33  Insomnia Resuming home meds  Incidental lung nodule right base lung Will need outpatient follow-up imaging  Disposition.  To be discharged home  Consultations:   GI . Dr. Darcus Austin   Procedures: 10/29/2017 - status post EGD yesterday with dilatation of pyloric channel up to 10 mm   ----------------------------------------------------------------------------------------------------------------------  Discharge Instructions:   Discharge Instructions    Activity as tolerated - No restrictions   Complete by:  As directed    Activity as tolerated - No restrictions   Complete by:  As directed    Diet - low sodium heart healthy   Complete by:  As directed    Diet - low sodium heart healthy   Complete by:  As directed    Discharge instructions   Complete by:  As directed    Per GI recommendation, follow-up as outpatient according   Discharge instructions   Complete by:  As directed    Increase activity slowly   Complete by:  As directed    Increase activity slowly   Complete by:  As directed        Medication List    STOP taking these medications   ATENOLOL PO   BC HEADACHE POWDER PO   celecoxib 200 MG capsule Commonly known as:  CELEBREX   omeprazole 20 MG capsule Commonly known as:  PRILOSEC   QUEtiapine 100 MG tablet Commonly known as:  SEROQUEL   THIAMINE PO     TAKE these medications   acetaminophen 325 MG tablet Commonly known as:  TYLENOL Take 2 tablets (650 mg total) by mouth every 6 (six) hours as needed for mild pain (or Fever >/= 101).   ARTIFICIAL TEARS 1.4 % ophthalmic  solution Generic drug:  polyvinyl alcohol Place 1 drop into both eyes 4 (four) times daily.   aspirin 81 MG chewable tablet Chew 81 mg by mouth daily.   ATORVASTATIN CALCIUM PO Take 0.5 tablets by mouth daily.   carbamazepine 200 MG tablet Commonly known as:  TEGRETOL Take 100 mg by mouth 2 (two) times daily.   cholecalciferol 1000 units tablet Commonly known as:  VITAMIN D Take 1,000 Units by mouth daily.   citalopram 20 MG tablet Commonly known as:  CELEXA Take 10 mg by mouth daily.   cyanocobalamin 500 MCG tablet Take 1,000 mcg by mouth daily.   gabapentin 300 MG capsule Commonly known as:  NEURONTIN Take 1 capsule (300 mg total) by mouth daily. Start taking on:  10/31/2017   HYDROcodone-acetaminophen 5-325 MG tablet Commonly known as:  NORCO/VICODIN Take 2 tablets by mouth at bedtime as needed for moderate pain.   lisinopril 20 MG tablet Commonly known as:  PRINIVIL,ZESTRIL Take  1 tablet (20 mg total) by mouth 2 (two) times daily. What changed:    medication strength  how much to take  when to take this   metFORMIN 500 MG tablet Commonly known as:  GLUCOPHAGE Take 500 mg by mouth 2 (two) times daily with a meal.   pantoprazole 40 MG tablet Commonly known as:  PROTONIX Take 1 tablet (40 mg total) by mouth 2 (two) times daily.   sertraline 50 MG tablet Commonly known as:  ZOLOFT Take 1 tablet (50 mg total) by mouth daily. Start taking on:  10/31/2017 What changed:    medication strength  how much to take  Another medication with the same name was removed. Continue taking this medication, and follow the directions you see here.   SUMAtriptan 6 MG/0.5ML Soaj Inject 6 mg into the skin as needed (take on onset. May repeat in one hour - no more than 2 injections in 24 hours).   tamsulosin 0.4 MG Caps capsule Commonly known as:  FLOMAX Take 1 capsule (0.4 mg total) by mouth daily. Start taking on:  10/31/2017 What changed:    medication  strength  how much to take  when to take this  Another medication with the same name was removed. Continue taking this medication, and follow the directions you see here.       No Known Allergies    Procedures/Studies: Dg Chest 2 View  Result Date: 10/26/2017 CLINICAL DATA:  Chest pain. EXAM: CHEST  2 VIEW COMPARISON:  Two-view chest x-ray 11/06/2007 FINDINGS: The heart size and mediastinal contours are within normal limits. Both lungs are clear. The visualized skeletal structures are unremarkable. IMPRESSION: Negative two view chest x-ray Electronically Signed   By: Marin Robertshristopher  Mattern M.D.   On: 10/26/2017 15:22   Ct Abdomen Pelvis W Contrast  Result Date: 10/26/2017 CLINICAL DATA:  Dark colored vomiting.  Hematuria 2 weeks ago. EXAM: CT ABDOMEN AND PELVIS WITH CONTRAST TECHNIQUE: Multidetector CT imaging of the abdomen and pelvis was performed using the standard protocol following bolus administration of intravenous contrast. CONTRAST:  100mL ISOVUE-300 IOPAMIDOL (ISOVUE-300) INJECTION 61% COMPARISON:  None. FINDINGS: Lower chest: There is a nodule in the right lung on series 4, image 2 adjacent to the fissure measuring 6.7 mm. No other acute abnormalities are identified in the lung bases. Hepatobiliary: Hepatic steatosis. The liver, portal vein, and gallbladder are otherwise normal. Pancreas: Focal rounded fat in the pancreas on series 3, image 47 of no significance. The pancreas is otherwise normal. Spleen: Normal in size without focal abnormality. Adrenals/Urinary Tract: There is a 12 mm nodule in the left adrenal gland on series 3, image 30, too small to characterize. The right adrenal gland is normal. There is a 3.5 cm mass off the upper pole the right kidney with an attenuation of 24 Hounsfield units. No other renal masses. No hydronephrosis or perinephric stranding. No ureterectasis or ureteral stone. The bladder is normal. Stomach/Bowel: The stomach is markedly distended with fluid  and debris. The proximal duodenum is normal in caliber. The small bowel is otherwise normal. Colonic diverticulosis is seen without focal diverticulitis. The visualized appendix is normal. Vascular/Lymphatic: Atherosclerotic changes are seen in the nonaneurysmal aorta, iliac vessels common femoral vessels. No adenopathy. Reproductive: Prostate calcifications are identified. The prostate is otherwise normal. Other: There is mild increased attenuation in the fat of the gastrohepatic ligament along the lesser curvature of the stomach. No free air or free fluid. Musculoskeletal: No acute or significant osseous findings. IMPRESSION: 1.  There is marked distension of the stomach. There may be mild thickening in the region of the distal antrum and pylorus based on coronal images. The findings are most consistent with gastric outlet obstruction of unknown etiology. The mild increased attenuation in the fat of the gastrohepatic ligament could be due to inflammation or venous congestion given the gastric distention. 2. There is a mass in the upper pole the right kidney which is not definitely cystic based on today's study. I suspect this is a complicated cyst but a solid mass is not excluded. Recommend ultrasound for further evaluation. If the mass is not definitively cystic on ultrasound, recommend MRI. 3. 6.7 mm nodule in the right lung base. Non-contrast chest CT at 6-12 months is recommended. If the nodule is stable at time of repeat CT, then future CT at 18-24 months (from today's scan) is considered optional for low-risk patients, but is recommended for high-risk patients. This recommendation follows the consensus statement: Guidelines for Management of Incidental Pulmonary Nodules Detected on CT Images: From the Fleischner Society 2017; Radiology 2017; 284:228-243. 4. 12 mm nodule in the left adrenal gland is too small to characterize. If the patient does not have a known malignancy, recommend a 12 month follow-up CT  scan to ensure stability. 5. Atherosclerotic change in the abdominal aorta. Electronically Signed   By: Gerome Sam III M.D   On: 10/26/2017 21:45   US Renal  Result Date: 10/27/2017 CLINICAL DATA:  71 year old male with right renal lesion seen on earlier CT. EXAM: RENAL / URINARY TRACT ULTRASOUND COMPLETE COMPARISON:  Abdominal CT dated 10/26/2017 FINDINGS: Evaluation is limited due to patient's body habitus. Right Kidney: Length: 11.4 cm. There is a 3.2 x 3.9 x 3.4 cm hypoechoic lesion in the upper pole of the right kidney which is indeterminate and not well characterized. This corresponds to the low attenuating lesion seen on the earlier CT. No hydronephrosis or shadowing stone. Left Kidney: Length: 12.2 cm. Normal echogenicity. No hydronephrosis or shadowing stone. Bladder: Appears normal for degree of bladder distention. Incidental note of moderate to severe fatty liver. IMPRESSION: Hypoechoic lesion in the upper pole of the right kidney is not well characterized on this ultrasound. Further characterization with MRI as previously recommended advised. Fatty liver. Electronically Signed   By: Elgie Collard M.D.   On: 10/27/2017 03:41   Dg Abd Portable 1v  Result Date: 10/28/2017 CLINICAL DATA:  Enteric tube placement. EXAM: PORTABLE ABDOMEN - 1 VIEW COMPARISON:  10/28/2017 abdomen radiograph FINDINGS: Normal bowel gas pattern. Enteric tube tip projects over gastric body. No radiopaque urinary stone identified. Bones are unremarkable. IMPRESSION: Enteric tube tip projects over gastric body. Electronically Signed   By: Mitzi Hansen M.D.   On: 10/28/2017 04:21   Dg Abd Portable 1v  Result Date: 10/28/2017 CLINICAL DATA:  72 y/o  M; nasogastric tube placement. EXAM: PORTABLE ABDOMEN - 1 VIEW COMPARISON:  None. FINDINGS: Enteric tube projects over proximal stomach, suggest advancement. Normal bowel gas pattern. Bones are unremarkable. IMPRESSION: Enteric tube tip projects over proximal  stomach, suggest advancement. Electronically Signed   By: Mitzi Hansen M.D.   On: 10/28/2017 02:14     Subjective: Patient was seen and examined 10/30/2017, 11:21 AM Patient stable  Today. No acute distress.  No issues overnight Stable for discharge.  Discharge Exam:  Vitals:   10/29/17 1610 10/29/17 2116 10/30/17 0518 10/30/17 0853  BP: (!) 173/64 (!) 157/93 (!) 187/59 (!) 155/72  Pulse: 89 82 76 73  Resp:  18 18 18 18   Temp: 98.4 F (36.9 C) 98.4 F (36.9 C) 98.5 F (36.9 C) 98.2 F (36.8 C)  TempSrc: Oral Oral Oral Oral  SpO2: 92% 93% 98% 96%  Weight:      Height:        General: Pt lying comfortably in bed & appears in no obvious distress. Cardiovascular: S1 & S2 heard, RRR, S1/S2 +. No murmurs, rubs, gallops or clicks. No JVD or pedal edema. Respiratory: Clear to auscultation without wheezing, rhonchi or crackles. No increased work of breathing. Abdominal:  Non distended, non tender & soft. No organomegaly or masses appreciated. Normal bowel sounds heard. CNS: Alert and oriented. No focal deficits. Extremities: no edema, no cyanosis    The results of significant diagnostics from this hospitalization (including imaging, microbiology, ancillary and laboratory) are listed below for reference.     Microbiology: No results found for this or any previous visit (from the past 240 hour(s)).   Labs: CBC: Recent Labs  Lab 10/26/17 1450  10/29/17 0147 10/29/17 0926 10/29/17 1811 10/30/17 0217 10/30/17 0857  WBC 15.4*  --   --   --   --   --   --   HGB 15.5   < > 13.8 13.5 13.4 13.3 13.1  HCT 46.6   < > 42.9 41.7 41.3 40.6 41.2  MCV 86.8  --   --   --   --   --   --   PLT 338  --   --   --   --   --   --    < > = values in this interval not displayed.   Basic Metabolic Panel: Recent Labs  Lab 10/26/17 1450 10/27/17 0135 10/30/17 0217  NA 141 141 138  K 3.7 4.1 3.2*  CL 98* 102 104  CO2 32 29 26  GLUCOSE 146* 172* 137*  BUN 17 15 20    CREATININE 1.33* 1.30* 1.00  CALCIUM 10.8* 9.7 8.6*   Liver Function Tests: Recent Labs  Lab 10/26/17 1643  AST 23  ALT 25  ALKPHOS 52  BILITOT 0.7  PROT 8.1  ALBUMIN 4.6   BNP (last 3 results) No results for input(s): BNP in the last 8760 hours. Cardiac Enzymes: No results for input(s): CKTOTAL, CKMB, CKMBINDEX, TROPONINI in the last 168 hours. CBG: Recent Labs  Lab 10/27/17 0823 10/28/17 0734 10/29/17 0731 10/30/17 0752  GLUCAP 138* 126* 119* 126*   Hgb A1c No results for input(s): HGBA1C in the last 72 hours. Lipid Profile No results for input(s): CHOL, HDL, LDLCALC, TRIG, CHOLHDL, LDLDIRECT in the last 72 hours. Thyroid function studies No results for input(s): TSH, T4TOTAL, T3FREE, THYROIDAB in the last 72 hours.  Invalid input(s): FREET3 Anemia work up No results for input(s): VITAMINB12, FOLATE, FERRITIN, TIBC, IRON, RETICCTPCT in the last 72 hours. Urinalysis No results found for: COLORURINE, APPEARANCEUR, LABSPEC, PHURINE, GLUCOSEU, HGBUR, BILIRUBINUR, KETONESUR, PROTEINUR, UROBILINOGEN, NITRITE, LEUKOCYTESUR    Time coordinating discharge: Over 30 minutes  SIGNED: Kendell BaneSeyed A Sunaina Ferrando, MD, FACP, FHM. [facility] Pager [phone]  If 7PM-7AM, please contact night-coverage [email or web address] Password [email or web address] 10/30/2017, 11:21 AM

## 2017-10-30 NOTE — Progress Notes (Signed)
[facility] Progress Note  Willette PaCarey C Grams 71 y.o. [birth year]  CC:  Gastric outlet obstruction   Subjective: Patient doing fine today. Feeling better after removal of NG tube. Denied any abdominal pain, nausea or vomiting. Tolerating clear liquid diet  ROS : Negative for chest pain and shortness of breath   Objective: Vital signs in last 24 hours: Vitals:   10/30/17 0518 10/30/17 0853  BP: (!) 187/59 (!) 155/72  Pulse: 76 73  Resp: 18 18  Temp: 98.5 F (36.9 C) 98.2 F (36.8 C)  SpO2: 98% 96%    Physical Exam:  General:  Alert, cooperative, no distress, appears stated age  Head:  Normocephalic, without obvious abnormality, atraumatic  Eyes:  , EOM's intact,   Lungs:   Clear to auscultation bilaterally, respirations unlabored  Heart:  Regular rate and rhythm, S1, S2 normal  Abdomen:   Soft, non-tender, bowel sounds active all four quadrants,  no masses,   Extremities: Extremities normal, atraumatic, no  edema       Lab Results: Recent Labs    10/30/17 0217  NA 138  K 3.2*  CL 104  CO2 26  GLUCOSE 137*  BUN 20  CREATININE 1.00  CALCIUM 8.6*   No results for input(s): AST, ALT, ALKPHOS, BILITOT, PROT, ALBUMIN in the last 72 hours. Recent Labs    10/29/17 1811 10/30/17 0217  HGB 13.4 13.3  HCT 41.3 40.6   No results for input(s): LABPROT, INR in the last 72 hours.    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 Status post EGD yesterday with dilatation of pyloric channel up to 10 mm - NSAID use  Recommendations ------------------------ - Change IV Protonix to by mouth twice a day - Patient needs to stay on twice a day PPI at least for next 2 months - Repeat avoidance of NSAIDs discussed with the patient. - Recommend repeat EGD in 2 weeks for further dilatation of pyloric channel. - Meanwhile, patient needs to stay on pured diet or mechanically chopped food. - Start full liquid diet and advance to pured diet  as tolerated - GI will sign off. Call us back if needed    Kathi DerParag Banyan Goodchild MD, FACP 10/30/2017, 9:38 AM  Contact #  [phone]

## 2017-10-30 NOTE — Progress Notes (Signed)
Arthur Cook to be D/C'd Home per MD order.  Discussed prescriptions and follow up appointments with the patient. Prescriptions given to patient, medication list explained in detail. Pt verbalized understanding.  Allergies as of 10/30/2017   No Known Allergies     Medication List    STOP taking these medications   ATENOLOL PO   BC HEADACHE POWDER PO   celecoxib 200 MG capsule Commonly known as:  CELEBREX   omeprazole 20 MG capsule Commonly known as:  PRILOSEC   QUEtiapine 100 MG tablet Commonly known as:  SEROQUEL   THIAMINE PO     TAKE these medications   acetaminophen 325 MG tablet Commonly known as:  TYLENOL Take 2 tablets (650 mg total) by mouth every 6 (six) hours as needed for mild pain (or Fever >/= 101).   ARTIFICIAL TEARS 1.4 % ophthalmic solution Generic drug:  polyvinyl alcohol Place 1 drop into both eyes 4 (four) times daily.   aspirin 81 MG chewable tablet Chew 81 mg by mouth daily.   ATORVASTATIN CALCIUM PO Take 0.5 tablets by mouth daily.   carbamazepine 200 MG tablet Commonly known as:  TEGRETOL Take 100 mg by mouth 2 (two) times daily.   cholecalciferol 1000 units tablet Commonly known as:  VITAMIN D Take 1,000 Units by mouth daily.   citalopram 20 MG tablet Commonly known as:  CELEXA Take 10 mg by mouth daily.   cyanocobalamin 500 MCG tablet Take 1,000 mcg by mouth daily.   gabapentin 300 MG capsule Commonly known as:  NEURONTIN Take 1 capsule (300 mg total) by mouth daily. Start taking on:  10/31/2017   HYDROcodone-acetaminophen 5-325 MG tablet Commonly known as:  NORCO/VICODIN Take 2 tablets by mouth at bedtime as needed for moderate pain.   lisinopril 20 MG tablet Commonly known as:  PRINIVIL,ZESTRIL Take 1 tablet (20 mg total) by mouth 2 (two) times daily. What changed:    medication strength  how much to take  when to take this   metFORMIN 500 MG tablet Commonly known as:  GLUCOPHAGE Take 500 mg by mouth 2 (two)  times daily with a meal.   pantoprazole 40 MG tablet Commonly known as:  PROTONIX Take 1 tablet (40 mg total) by mouth 2 (two) times daily.   sertraline 50 MG tablet Commonly known as:  ZOLOFT Take 1 tablet (50 mg total) by mouth daily. Start taking on:  10/31/2017 What changed:    medication strength  how much to take  Another medication with the same name was removed. Continue taking this medication, and follow the directions you see here.   SUMAtriptan 6 MG/0.5ML Soaj Inject 6 mg into the skin as needed (take on onset. May repeat in one hour - no more than 2 injections in 24 hours).   tamsulosin 0.4 MG Caps capsule Commonly known as:  FLOMAX Take 1 capsule (0.4 mg total) by mouth daily. Start taking on:  10/31/2017 What changed:    medication strength  how much to take  when to take this  Another medication with the same name was removed. Continue taking this medication, and follow the directions you see here.       Vitals:   10/30/17 0518 10/30/17 0853  BP: (!) 187/59 (!) 155/72  Pulse: 76 73  Resp: 18 18  Temp: 98.5 F (36.9 C) 98.2 F (36.8 C)  SpO2: 98% 96%    Skin clean, dry and intact without evidence of skin break down, no evidence of skin  tears noted. IV catheter discontinued intact. Site without signs and symptoms of complications. Dressing and pressure applied. Pt denies pain at this time. No complaints noted.  An After Visit Summary was printed and given to the patient. Patient escorted via WC, and D/C home via private auto.  Cecil CobbsMolly Weismiller RN [facility] Phone [phone]

## 2017-10-31 ENCOUNTER — Encounter (HOSPITAL_COMMUNITY): Payer: Self-pay | Admitting: Gastroenterology

## 2017-11-06 ENCOUNTER — Other Ambulatory Visit: Payer: Self-pay | Admitting: Gastroenterology

## 2017-12-05 ENCOUNTER — Encounter (HOSPITAL_COMMUNITY): Payer: Self-pay

## 2017-12-05 ENCOUNTER — Ambulatory Visit (HOSPITAL_COMMUNITY): Admit: 2017-12-05 | Payer: Medicare Other | Admitting: Gastroenterology

## 2017-12-05 SURGERY — ESOPHAGOGASTRODUODENOSCOPY (EGD) WITH PROPOFOL
Anesthesia: Monitor Anesthesia Care

## 2019-11-30 IMAGING — CT CT ABD-PELV W/ CM
2 of 5 series · 15 of 46 positions shown, 17 images · IV contrast (APPLIED)
Comparison: None.

CLINICAL DATA: Dark colored vomiting.  Hematuria 2 weeks ago.

EXAM:
CT ABDOMEN AND PELVIS WITH CONTRAST
TECHNIQUE: Multidetector CT imaging of the abdomen and pelvis was performed
using the standard protocol following bolus administration of
intravenous contrast.
CONTRAST:  100mL ZYDBUZ-CZZ IOPAMIDOL (ZYDBUZ-CZZ) INJECTION 61%

[Series 3: abd/ pelvis 5.0 i30f 2 · axial · 0.93mm/px · z∈[-576,-111]mm · 12 of 105 slices shown, 14 images]
[im 6/105  soft-tissue]
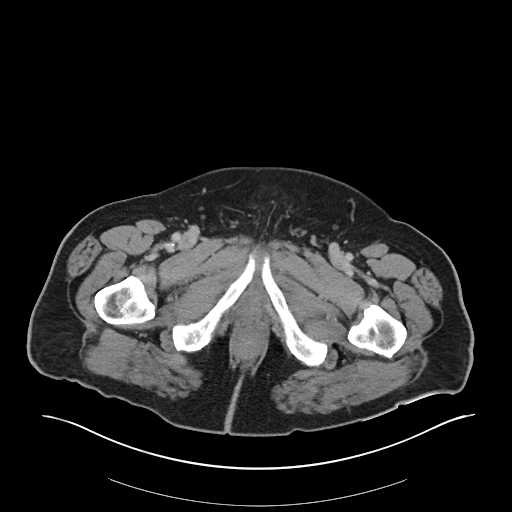
[im 6/105  bone]
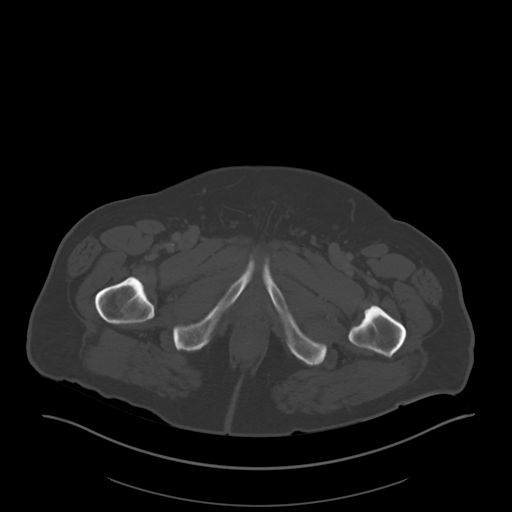
[im 16/105  soft-tissue]
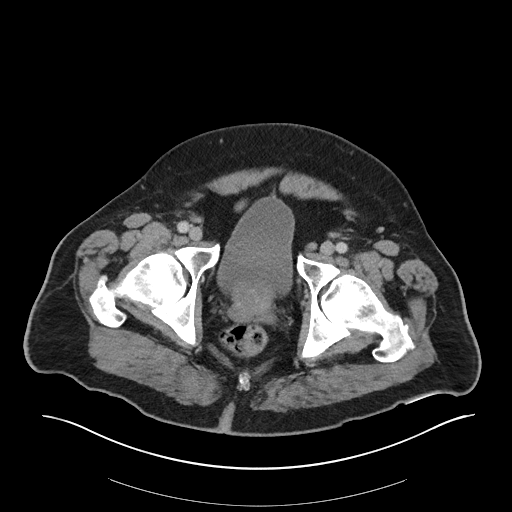
[im 21/105  soft-tissue]
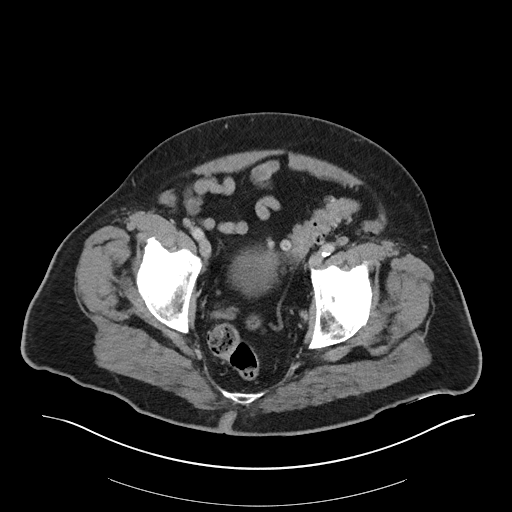
[im 32/105  soft-tissue]
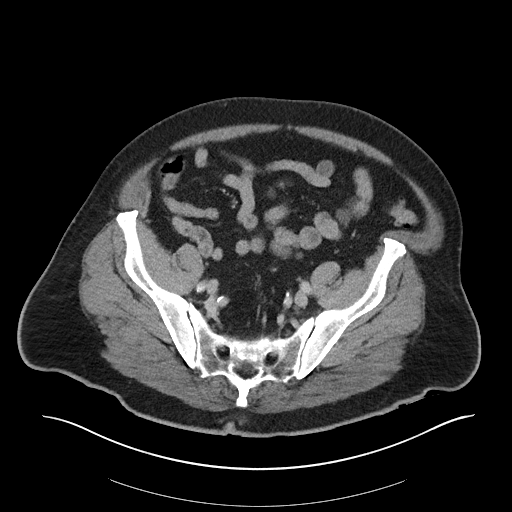
[im 42/105  soft-tissue]
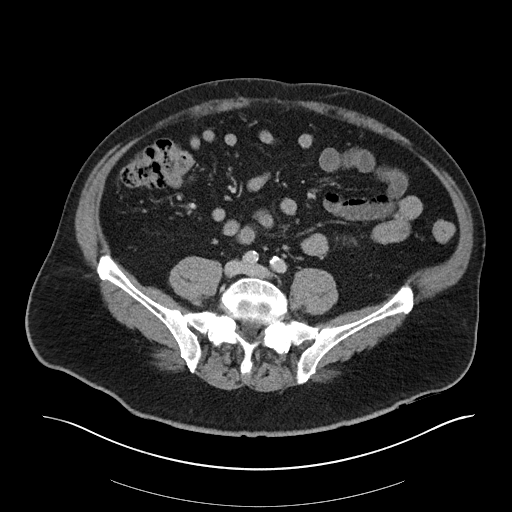
[im 47/105  soft-tissue]
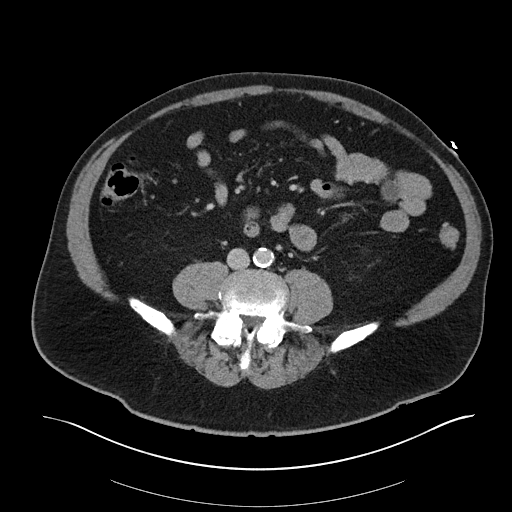
[im 58/105  soft-tissue]
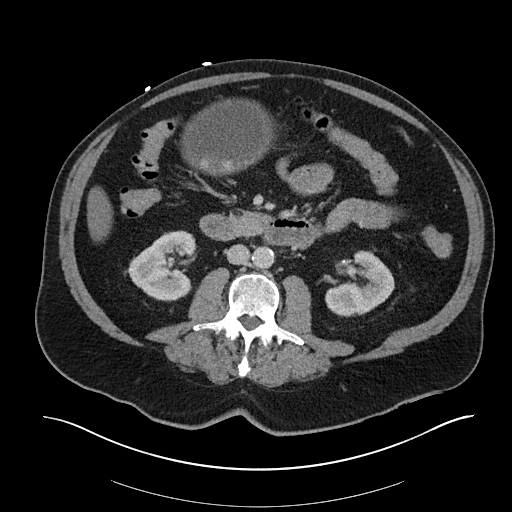
[im 63/105  soft-tissue]
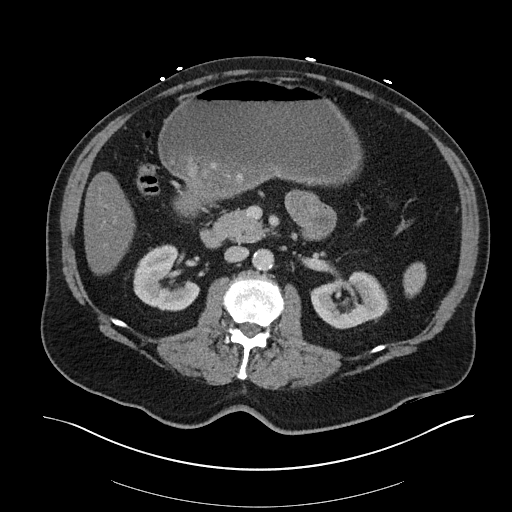
[im 73/105  soft-tissue]
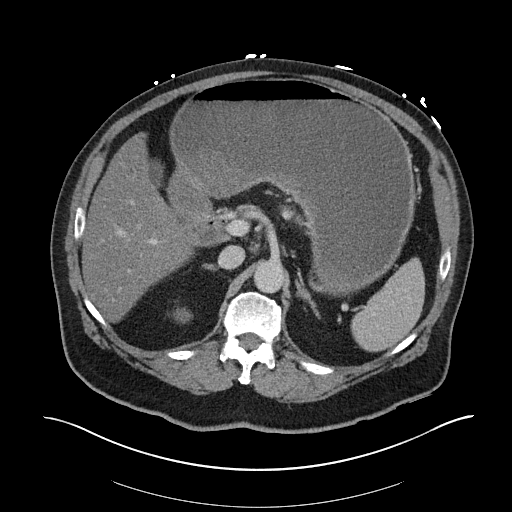
[im 73/105  bone]
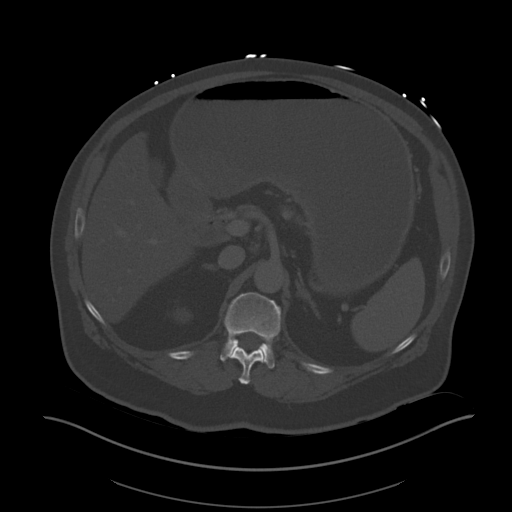
[im 84/105  soft-tissue]
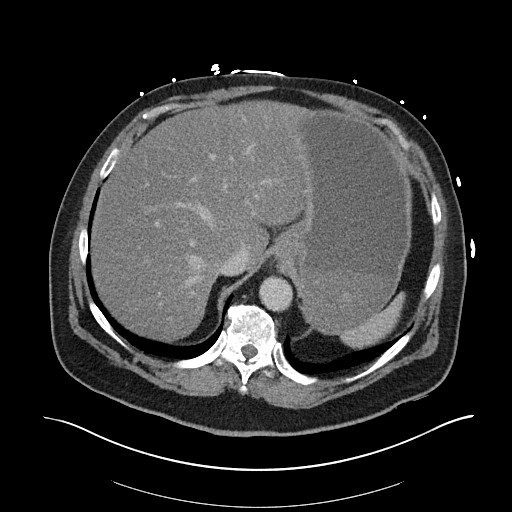
[im 89/105  soft-tissue]
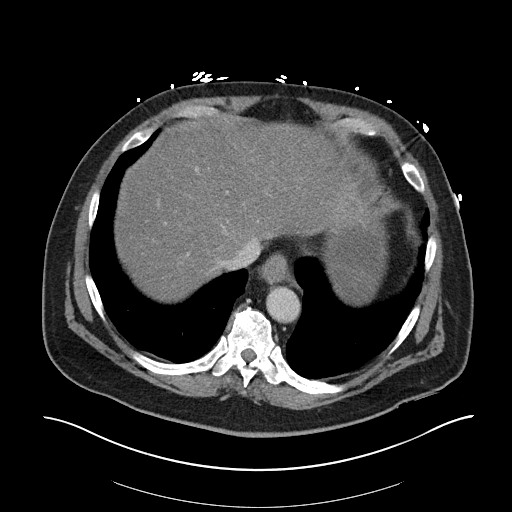
[im 99/105  soft-tissue]
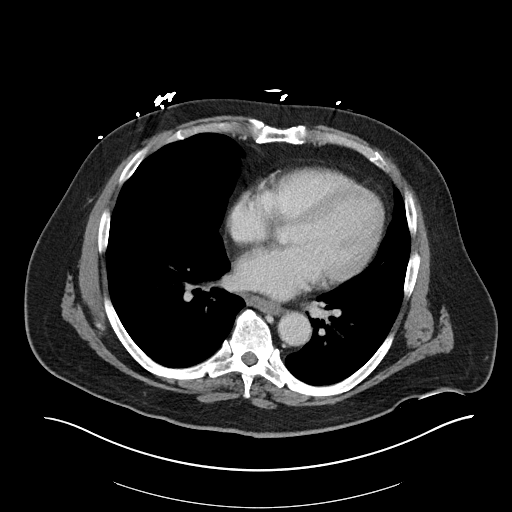

[Series 6: coronal soft tissue · coronal · 1.02mm/px · 3 of 121 slices shown]
[im 41/121  soft-tissue]
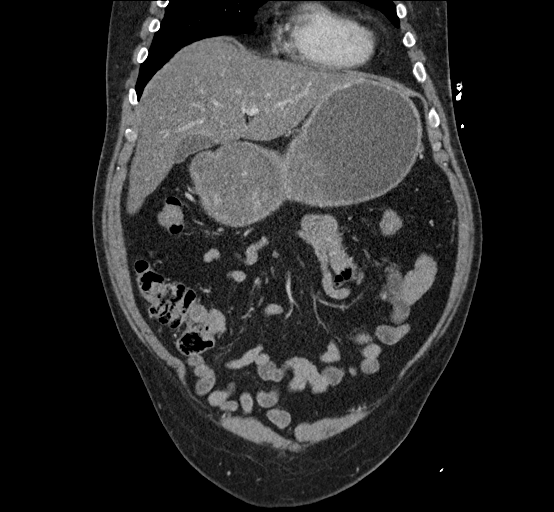
[im 54/121  soft-tissue]
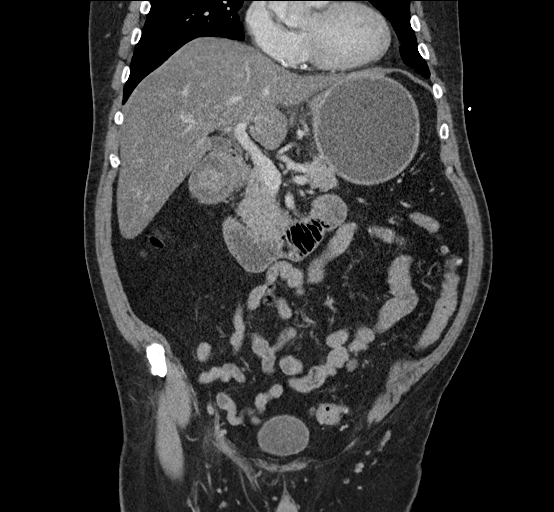
[im 67/121  soft-tissue]
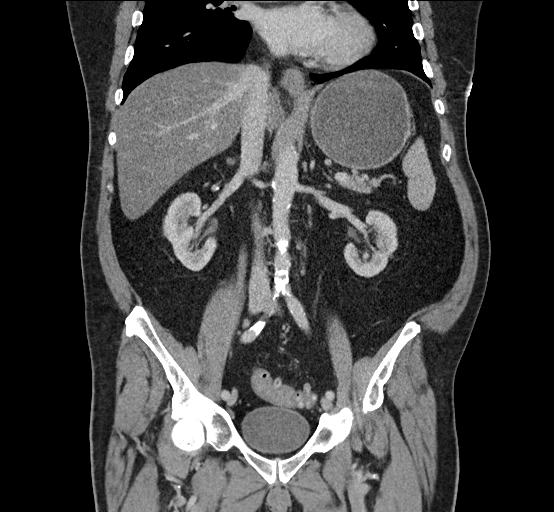

[15 of 46 positions shown; findings below may reference images not displayed]

FINDINGS: Lower chest: There is a nodule in the right lung on series 4, image
2 adjacent to the fissure measuring 6.7 mm. No other acute
abnormalities are identified in the lung bases.

Hepatobiliary: Hepatic steatosis. The liver, portal vein, and
gallbladder are otherwise normal.

Pancreas: Focal rounded fat in the pancreas on series 3, image 47 of
no significance. The pancreas is otherwise normal.

Spleen: Normal in size without focal abnormality.

Adrenals/Urinary Tract: There is a 12 mm nodule in the left adrenal
gland on series 3, image 30, too small to characterize. The right
adrenal gland is normal. There is a 3.5 cm mass off the upper pole
the right kidney with an attenuation of 24 Hounsfield units. No
other renal masses. No hydronephrosis or perinephric stranding. No
ureterectasis or ureteral stone. The bladder is normal.

Stomach/Bowel: The stomach is markedly distended with fluid and
debris. The proximal duodenum is normal in caliber. The small bowel
is otherwise normal. Colonic diverticulosis is seen without focal
diverticulitis. The visualized appendix is normal.

Vascular/Lymphatic: Atherosclerotic changes are seen in the
nonaneurysmal aorta, iliac vessels common femoral vessels. No
adenopathy.

Reproductive: Prostate calcifications are identified. The prostate
is otherwise normal.

Other: There is mild increased attenuation in the fat of the
gastrohepatic ligament along the lesser curvature of the stomach. No
free air or free fluid.

Musculoskeletal: No acute or significant osseous findings.
IMPRESSION: 1. There is marked distension of the stomach. There may be mild
thickening in the region of the distal antrum and pylorus based on
coronal images. The findings are most consistent with gastric outlet
obstruction of unknown etiology. The mild increased attenuation in
the fat of the gastrohepatic ligament could be due to inflammation
or venous congestion given the gastric distention.
2. There is a mass in the upper pole the right kidney which is not
definitely cystic based on today's study. I suspect this is a
complicated cyst but a solid mass is not excluded. Recommend
ultrasound for further evaluation. If the mass is not definitively
cystic on ultrasound, recommend MRI.
[DATE] mm nodule in the right lung base. Non-contrast chest CT at
6-12 months is recommended. If the nodule is stable at time of
repeat CT, then future CT at 18-24 months (from today's scan) is
considered optional for low-risk patients, but is recommended for
high-risk patients. This recommendation follows the consensus
statement: Guidelines for Management of Incidental Pulmonary Nodules
Detected on CT Images: From the [HOSPITAL] 4890; Radiology
4890; [DATE].
4. 12 mm nodule in the left adrenal gland is too small to
characterize. If the patient does not have a known malignancy,
recommend a 12 month follow-up CT scan to ensure stability.
5. Atherosclerotic change in the abdominal aorta.

## 2021-07-17 ENCOUNTER — Ambulatory Visit: Payer: Medicare Other | Admitting: Orthopaedic Surgery

## 2024-02-07 ENCOUNTER — Emergency Department (HOSPITAL_COMMUNITY)

## 2024-02-07 ENCOUNTER — Encounter (HOSPITAL_COMMUNITY): Payer: Self-pay | Admitting: *Deleted

## 2024-02-07 ENCOUNTER — Observation Stay (HOSPITAL_COMMUNITY)
Admission: EM | Admit: 2024-02-07 | Discharge: 2024-02-09 | Disposition: A | Discharge status: Home / Self Care | Attending: Emergency Medicine | Admitting: Emergency Medicine

## 2024-02-07 ENCOUNTER — Other Ambulatory Visit: Payer: Self-pay

## 2024-02-07 DIAGNOSIS — N400 Enlarged prostate without lower urinary tract symptoms: Secondary | ICD-10-CM | POA: Diagnosis not present

## 2024-02-07 DIAGNOSIS — F0390 Unspecified dementia without behavioral disturbance: Secondary | ICD-10-CM

## 2024-02-07 DIAGNOSIS — R112 Nausea with vomiting, unspecified: Secondary | ICD-10-CM | POA: Insufficient documentation

## 2024-02-07 DIAGNOSIS — K219 Gastro-esophageal reflux disease without esophagitis: Secondary | ICD-10-CM | POA: Insufficient documentation

## 2024-02-07 DIAGNOSIS — E1140 Type 2 diabetes mellitus with diabetic neuropathy, unspecified: Secondary | ICD-10-CM | POA: Insufficient documentation

## 2024-02-07 DIAGNOSIS — Z7982 Long term (current) use of aspirin: Secondary | ICD-10-CM | POA: Insufficient documentation

## 2024-02-07 DIAGNOSIS — I10 Essential (primary) hypertension: Secondary | ICD-10-CM | POA: Diagnosis not present

## 2024-02-07 DIAGNOSIS — N401 Enlarged prostate with lower urinary tract symptoms: Secondary | ICD-10-CM | POA: Diagnosis not present

## 2024-02-07 DIAGNOSIS — Z7984 Long term (current) use of oral hypoglycemic drugs: Secondary | ICD-10-CM | POA: Diagnosis not present

## 2024-02-07 DIAGNOSIS — N3000 Acute cystitis without hematuria: Secondary | ICD-10-CM | POA: Diagnosis not present

## 2024-02-07 DIAGNOSIS — Z79899 Other long term (current) drug therapy: Secondary | ICD-10-CM | POA: Insufficient documentation

## 2024-02-07 DIAGNOSIS — R4182 Altered mental status, unspecified: Secondary | ICD-10-CM | POA: Diagnosis present

## 2024-02-07 DIAGNOSIS — Z87891 Personal history of nicotine dependence: Secondary | ICD-10-CM | POA: Diagnosis not present

## 2024-02-07 DIAGNOSIS — N390 Urinary tract infection, site not specified: Secondary | ICD-10-CM | POA: Insufficient documentation

## 2024-02-07 DIAGNOSIS — R350 Frequency of micturition: Secondary | ICD-10-CM

## 2024-02-07 LAB — CBC
HCT: 38.7 % — ABNORMAL LOW (ref 39.0–52.0)
Hemoglobin: 12.1 g/dL — ABNORMAL LOW (ref 13.0–17.0)
MCH: 26.5 pg (ref 26.0–34.0)
MCHC: 31.3 g/dL (ref 30.0–36.0)
MCV: 84.9 fL (ref 80.0–100.0)
Platelets: 403 10*3/uL — ABNORMAL HIGH (ref 150–400)
RBC: 4.56 MIL/uL (ref 4.22–5.81)
RDW: 14.6 % (ref 11.5–15.5)
WBC: 14.7 10*3/uL — ABNORMAL HIGH (ref 4.0–10.5)
nRBC: 0 % (ref 0.0–0.2)

## 2024-02-07 LAB — URINALYSIS, ROUTINE W REFLEX MICROSCOPIC
Glucose, UA: NEGATIVE mg/dL
Ketones, ur: NEGATIVE mg/dL
Nitrite: NEGATIVE
Protein, ur: >300 mg/dL — AB
Specific Gravity, Urine: >1.03 — ABNORMAL HIGH (ref 1.005–1.030)
pH: 5 (ref 5.0–8.0)

## 2024-02-07 LAB — COMPREHENSIVE METABOLIC PANEL
ALT: 21 U/L (ref 0–44)
AST: 55 U/L — ABNORMAL HIGH (ref 15–41)
Albumin: 4.1 g/dL (ref 3.5–5.0)
Alkaline Phosphatase: 54 U/L (ref 38–126)
Anion gap: 12 (ref 5–15)
BUN: 19 mg/dL (ref 8–23)
CO2: 23 mmol/L (ref 22–32)
Calcium: 9.4 mg/dL (ref 8.9–10.3)
Chloride: 100 mmol/L (ref 98–111)
Creatinine, Ser: 1.24 mg/dL (ref 0.61–1.24)
GFR, Estimated: 59 mL/min — ABNORMAL LOW (ref >60)
Glucose, Bld: 215 mg/dL — ABNORMAL HIGH (ref 70–99)
Potassium: 3.2 mmol/L — ABNORMAL LOW (ref 3.5–5.1)
Sodium: 135 mmol/L (ref 135–145)
Total Bilirubin: 1 mg/dL (ref 0.0–1.2)
Total Protein: 8 g/dL (ref 6.5–8.1)

## 2024-02-07 LAB — URINALYSIS, MICROSCOPIC (REFLEX): WBC, UA: >50 WBC/hpf (ref 0–5)

## 2024-02-07 LAB — TSH: TSH: 0.932 u[IU]/mL (ref 0.350–4.500)

## 2024-02-07 MED ORDER — IOHEXOL 350 MG/ML SOLN
75.0000 mL | Freq: Once | INTRAVENOUS | Status: AC | PRN
  Administered 2024-02-07: 75 mL via INTRAVENOUS

## 2024-02-07 MED ORDER — LACTATED RINGERS IV BOLUS
1000.0000 mL | Freq: Once | INTRAVENOUS | Status: AC
  Administered 2024-02-07: 1000 mL via INTRAVENOUS

## 2024-02-07 MED ORDER — POTASSIUM CHLORIDE CRYS ER 20 MEQ PO TBCR
40.0000 meq | EXTENDED_RELEASE_TABLET | Freq: Once | ORAL | Status: AC
  Administered 2024-02-07: 40 meq via ORAL
  Filled 2024-02-07: qty 2

## 2024-02-07 MED ORDER — SODIUM CHLORIDE 0.9 % IV SOLN
1.0000 g | Freq: Once | INTRAVENOUS | Status: AC
  Administered 2024-02-07: 1 g via INTRAVENOUS
  Filled 2024-02-07: qty 10

## 2024-02-07 NOTE — H&P (Signed)
 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  Patient presented to the ER with complaints of the patient being confused, not feeling well the last 3 days.  He had been taken to [facility] clinic and was referred to the ER for possible UTI treatment.  Per report the patient has been having recurrent UTIs since last year.  In December he had a cystoscopy that was unrevealing of a cause.  They follow with urology, repeat cystoscopy on the discussion.  The patient has been treated 3 times recently for UTI.  Twice with Bactrim, 1 with Cipro.  Urine cultures on 3 times of come back negative for bacterial growth.  Per family, patient sleeps all day, does awaken at night, continuously urinating.  He also has nausea and vomiting, leading to poorer p.o. intake.  That is reason why they believe he is sick.  And brought him to the ER.  In the ER vital stable.  Potassium 3.2, glucose 215, WBCs 14.7.  UA small ketones, no nitrates, greater than 300 protein, bacteria few.  CT abdomen pelvis nonrevealing.  CT head no acute intracranial abnormality.  Admission requested for UTI.  IV Rocephin given.  Review of Systems:  Per HPI  Past Medical History: Past Medical History:  Diagnosis Date   History of stomach ulcers    Hypertension    Renal disorder    frequent UTIs   Past Surgical History:  Procedure Laterality Date   ESOPHAGOGASTRODUODENOSCOPY (EGD) WITH PROPOFOL N/A 10/29/2017   Procedure: ESOPHAGOGASTRODUODENOSCOPY (EGD) WITH PROPOFOL;  Surgeon: Kathi Der, MD;  Location: [facility] END[facility];  Service: Gastroenterology;  Laterality: N/A;   knee operations      Medications: Prior to Admission medications   Medication Sig Start Date End Date Taking? Authorizing Provider  acetaminophen (TYLENOL) 325 MG tablet Take 2 tablets (650 mg total) by mouth  every 6 (six) hours as needed for mild pain (or Fever >/= 101). 10/30/17   ShahmehdiGemma Payor, MD  aspirin 81 MG chewable tablet Chew 81 mg by mouth daily.    [provider]  ATORVASTATIN CALCIUM PO Take 0.5 tablets by mouth daily.    [provider]  carbamazepine (TEGRETOL) 200 MG tablet Take 100 mg by mouth 2 (two) times daily.    [provider]  cholecalciferol (VITAMIN D) 1000 units tablet Take 1,000 Units by mouth daily.    [provider]  citalopram (CELEXA) 20 MG tablet Take 10 mg by mouth daily.    [provider]  cyanocobalamin 500 MCG tablet Take 1,000 mcg by mouth daily.    [provider]  gabapentin (NEURONTIN) 300 MG capsule Take 1 capsule (300 mg total) by mouth daily. 10/31/17   ShahmehdiGemma Payor, MD  HYDROcodone-acetaminophen (NORCO/VICODIN) 5-325 MG tablet Take 2 tablets by mouth at bedtime as needed for moderate pain.    [provider]  lisinopril (PRINIVIL,ZESTRIL) 20 MG tablet Take 1 tablet (20 mg total) by mouth 2 (two) times daily. 10/30/17 11/29/17  Shahmehdi, Gemma Payor, MD  metFORMIN (GLUCOPHAGE) 500 MG tablet Take 500 mg by mouth 2 (two) times daily with a meal.    [provider]  pantoprazole (PROTONIX) 40 MG tablet Take 1 tablet (40 mg total) by mouth 2 (two) times daily. 10/30/17   Shahmehdi, Gemma Payor, MD  polyvinyl alcohol (ARTIFICIAL TEARS) 1.4 %  ophthalmic solution Place 1 drop into both eyes 4 (four) times daily.     [provider]  sertraline (ZOLOFT) 50 MG tablet Take 1 tablet (50 mg total) by mouth daily. 10/31/17   Shahmehdi, Gemma Payor, MD  SUMAtriptan 6 MG/0.5ML SOAJ Inject 6 mg into the skin as needed (take on onset. May repeat in one hour - no more than 2 injections in 24 hours).    [provider]  tamsulosin (FLOMAX) 0.4 MG CAPS capsule Take 1 capsule (0.4 mg total) by mouth daily. 10/31/17   ShahmehdiGemma Payor, MD    Allergies:  No Known Allergies  Social  History:  reports that he has quit smoking. His smoking use included cigarettes. He has never used smokeless tobacco. He reports that he does not drink alcohol and does not use drugs.  Family History: Family History  Problem Relation Age of Onset   Obesity Daughter     Physical Exam: Vitals:   02/07/24 1654 02/07/24 1656 02/07/24 2230  BP:  120/77 (!) 146/72  Pulse:  92 77  Resp:  18 18  Temp:  97.8 F (36.6 C) 97.6 F (36.4 C)  SpO2:  99% 98%  Weight: 101.3 kg    Height: 5\' 10"  (1.778 m)      General:  A&O x1, well developed and nourished, in no distress Eyes: Pink conjunctiva, no scleral icterus ENT: Moist oral mucosa Lungs: CTA B/L, no wheeze, no crackles, no use of accessory muscles Cardiovascular: RRR, no regurgitation, no murmurs. no JVD Abdomen: soft, positive BS, NTND, not an acute abdomen GU: not examined Neuro: CN II - XII appears grossly intact Musculoskeletal: strength 5/5 all extremities, no edema Skin: no rash, no subcutaneous crepitation, no decubitus Psych: appropriate patient   Labs on Admission:  Recent Labs    02/07/24 1720  NA 135  K 3.2*  CL 100  CO2 23  GLUCOSE 215*  BUN 19  CREATININE 1.24  CALCIUM 9.4   Recent Labs    02/07/24 1720  AST 55*  ALT 21  ALKPHOS 54  BILITOT 1.0  PROT 8.0  ALBUMIN 4.1    Recent Labs    02/07/24 1720  WBC 14.7*  HGB 12.1*  HCT 38.7*  MCV 84.9  PLT 403*   Recent Labs    02/07/24 1723  TSH 0.932     Radiological Exams on Admission: CT ABDOMEN PELVIS W CONTRAST Result Date: 02/07/2024 CLINICAL DATA:  Abdominal pain EXAM: CT ABDOMEN AND PELVIS WITH CONTRAST TECHNIQUE: Multidetector CT imaging of the abdomen and pelvis was performed using the standard protocol following bolus administration of intravenous contrast. RADIATION DOSE REDUCTION: This exam was performed according to the departmental dose-optimization program which includes automated exposure control, adjustment of the mA and/or kV  according to patient size and/or use of iterative reconstruction technique. CONTRAST:  75mL OMNIPAQUE IOHEXOL 350 MG/ML SOLN COMPARISON:  10/26/2017 FINDINGS: Lower chest: No acute abnormality. Hepatobiliary: Fatty infiltration of the liver is noted. The gallbladder is within normal limits. Pancreas: Unremarkable. No pancreatic ductal dilatation or surrounding inflammatory changes. Spleen: Normal in size without focal abnormality. Adrenals/Urinary Tract: Adrenal glands are stable. 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 No further follow-up is recommended. Stable cystic lesion in the upper pole of the right kidney is noted unchanged from the prior exam of 2018. Given this long-term stability it is felt to be benign in etiology. No further follow-up is recommended. No renal calculi or obstructive  changes are seen. Vascular calcifications are noted. The bladder is decompressed. Stomach/Bowel: Scattered diverticular change of the colon is noted without evidence of diverticulitis. The appendix is within normal limits. Small bowel and stomach are unremarkable. Vascular/Lymphatic: Aortic atherosclerosis. No enlarged abdominal or pelvic lymph nodes. Reproductive: Prostate is unremarkable. Other: No abdominal wall hernia or abnormality. No abdominopelvic ascites. Musculoskeletal: No acute or significant osseous findings. IMPRESSION: Fatty liver. Stable changes in the right kidney and left adrenal gland. No further follow-up is recommended. Diverticulosis without diverticulitis. Electronically Signed   By: Alcide Clever M.D.   On: 02/07/2024 23:08   CT Head Wo Contrast Result Date: 02/07/2024 CLINICAL DATA:  Mental status change EXAM: CT HEAD WITHOUT CONTRAST TECHNIQUE: Contiguous axial images were obtained from the base of the skull through the vertex without intravenous contrast. RADIATION DOSE REDUCTION: This exam was performed according to the departmental dose-optimization  program which includes automated exposure control, adjustment of the mA and/or kV according to patient size and/or use of iterative reconstruction technique. COMPARISON:  None Available. FINDINGS: Brain: No acute territorial infarction, hemorrhage or intracranial mass. Moderate atrophy. Minimal chronic small vessel ischemic changes of the white matter. Nonenlarged ventricles Vascular: No hyperdense vessel or unexpected calcification. Skull: Normal. Negative for fracture or focal lesion. Sinuses/Orbits: No acute finding. Other: None IMPRESSION: 1. No CT evidence for acute intracranial abnormality. 2. Moderate atrophy. Minimal chronic small vessel ischemic changes of the white matter. Electronically Signed   By: Jasmine Pang M.D.   On: 02/07/2024 19:41    Assessment/Plan Present on Admission: Altered mental status //dementia -UA collected.  IV Rocephin given in ER.   -Will rule out UTI but greater suspicion worsening mentation is due to worsening dementia.  Patient is up all night, sleeps all day, this is not unusual in patients with dementia, often contributing to caretaker burnout. -Will check TSH, vitamin B12 level, RPR -Patient has a neurologist at the [place] who is able to do more workup on this. -Patient sundown's.  Seroquel reordered  Nausea and vomiting -Suspect an element of gastroparesis.  Will start Reglan 5 mg 3 times daily with meals.  Additionally patient on metformin, this can cause GI upset.  Discussed with family speak with PCP rediscontinuation of metformin  UTI -See above.  Discussed with patient likely not having UTIs, especially repeated normal cultures.  Will watch patient this time, rule out UTI.  Suggest if urine culture is negative, patient should follow-up with urologist  T2DM // peripheral neuropathy -Adding scale insulin ordered -Neurontin resumed  HLD -Atorvastatin  BPH -Proscar resumed  GERD //  -Protonix twice daily ordered   Addelyn Alleman 02/07/2024, 11:29  PM

## 2024-02-07 NOTE — ED Provider Notes (Signed)
 [facility] DEPT [facility] Emergency Department Provider Note MRN:  [id number]  Arrival date & time: 02/08/24     Chief Complaint   Altered Mental Status and Weakness   History of Present Illness   Arthur Cook is a 78 y.o. year-old male presents to the ED with chief complaint of altered mental status.  He is here with family, who are concerned about UTI.  Has hx of prostate cancer.  Family reports that he has had several episodes over the past couple of months, in which he ended up being treated for UTI and improved.  Following treatment, his symptoms have gradually returned.  He has been treated three times since January.  He is not currently on antibiotics.  Family reports that he is much more confused than normal.  History provided by patient.   Review of Systems  Pertinent positive and negative review of systems noted in HPI.    Physical Exam   Vitals:   02/07/24 1656 02/07/24 2230  BP: 120/77 (!) 146/72  Pulse: 92 77  Resp: 18 18  Temp: 97.8 F (36.6 C) 97.6 F (36.4 C)  SpO2: 99% 98%    CONSTITUTIONAL:  non toxic-appearing, NAD NEURO:  Alert and oriented x 3, CN 3-12 grossly intact EYES:  eyes equal and reactive ENT/NECK:  Supple, no stridor  CARDIO:  normal rate, regular rhythm, appears well-perfused  PULM:  No respiratory distress, CTAB GI/GU:  non-distended, no focal tenderness MSK/SPINE:  No gross deformities, no edema, moves all extremities  SKIN:  no rash, atraumatic   *Additional and/or pertinent findings included in MDM below  Diagnostic and Interventional Summary    EKG Interpretation Date/Time:    Ventricular Rate:    PR Interval:    QRS Duration:    QT Interval:    QTC Calculation:   R Axis:      Text Interpretation:         Labs Reviewed  CBC - Abnormal; Notable for the following components:      Result Value   WBC 14.7 (*)    Hemoglobin 12.1 (*)    HCT 38.7 (*)    Platelets 403 (*)    All other components within normal  limits  COMPREHENSIVE METABOLIC PANEL - Abnormal; Notable for the following components:   Potassium 3.2 (*)    Glucose, Bld 215 (*)    AST 55 (*)    GFR, Estimated 59 (*)    All other components within normal limits  URINALYSIS, ROUTINE W REFLEX MICROSCOPIC - Abnormal; Notable for the following components:   APPearance HAZY (*)    Specific Gravity, Urine >1.030 (*)    Hgb urine dipstick MODERATE (*)    Bilirubin Urine SMALL (*)    Protein, ur >300 (*)    Leukocytes,Ua SMALL (*)    All other components within normal limits  URINALYSIS, MICROSCOPIC (REFLEX) - Abnormal; Notable for the following components:   Bacteria, UA FEW (*)    All other components within normal limits  BASIC METABOLIC PANEL - Abnormal; Notable for the following components:   CO2 19 (*)    Glucose, Bld 192 (*)    All other components within normal limits  CBC WITH DIFFERENTIAL/PLATELET - Abnormal; Notable for the following components:   Hemoglobin 11.3 (*)    HCT 36.6 (*)    All other components within normal limits  CBG MONITORING, ED - Abnormal; Notable for the following components:   Glucose-Capillary 190 (*)    All other  components within normal limits  URINE CULTURE  TSH  HEMOGLOBIN A1C  RPR  VITAMIN B12    CT ABDOMEN PELVIS W CONTRAST  Final Result    CT Head Wo Contrast  Final Result      Medications  insulin aspart (novoLOG) injection 0-15 Units (has no administration in time range)  insulin aspart (novoLOG) injection 0-5 Units ( Subcutaneous Not Given 02/08/24 0210)  heparin injection 5,000 Units (has no administration in time range)  acetaminophen (TYLENOL) tablet 650 mg (has no administration in time range)    Or  acetaminophen (TYLENOL) suppository 650 mg (has no administration in time range)  senna-docusate (Senokot-S) tablet 1 tablet (has no administration in time range)  metoCLOPramide (REGLAN) tablet 5 mg (has no administration in time range)  atorvastatin (LIPITOR) tablet 20 mg  (has no administration in time range)  aspirin chewable tablet 81 mg (has no administration in time range)  finasteride (PROSCAR) tablet 5 mg (has no administration in time range)  gabapentin (NEURONTIN) capsule 200 mg (has no administration in time range)  pantoprazole (PROTONIX) EC tablet 40 mg (has no administration in time range)  QUEtiapine (SEROQUEL) tablet 100 mg (has no administration in time range)  tamsulosin (FLOMAX) capsule 0.8 mg (has no administration in time range)  cefTRIAXone (ROCEPHIN) 1 g in sodium chloride 0.9 % 100 mL IVPB (0 g Intravenous Stopped 02/07/24 2318)  lactated ringers bolus 1,000 mL (0 mLs Intravenous Stopped 02/07/24 2318)  potassium chloride SA (KLOR-CON M) CR tablet 40 mEq (40 mEq Oral Given 02/07/24 2316)  iohexol (OMNIPAQUE) 350 MG/ML injection 75 mL (75 mLs Intravenous Contrast Given 02/07/24 2253)  haloperidol lactate (HALDOL) injection 5 mg (5 mg Intravenous Given 02/08/24 0211)     Procedures  /  Critical Care Procedures  ED Course and Medical Decision Making  I have reviewed the triage vital signs, the nursing notes, and pertinent available records from the EMR.  Social Determinants Affecting Complexity of Care: Patient has no clinically significant social determinants affecting this chief complaint..   ED Course: Clinical Course as of 02/08/24 0626  Sat Feb 08, 2024  0624 Urinalysis, Routine w reflex microscopic -Urine, Clean Catch(!) [RB]  [id number] Urinalysis, Microscopic (reflex)(!) Fever, leukocytosis, and abnormal UA.  Concern for UTI causing his symptoms. [RB]  0625 CBC(!) Leukocytosis to 14.7.  Getting abx. [RB]  0625 CT Head Wo Contrast No obvious ICH [RB]    Clinical Course User Index [RB] Roxy Horseman, PA-C    Medical Decision Making Amount and/or Complexity of Data Reviewed Labs:  Decision-making details documented in ED Course. Radiology:  Decision-making details documented in ED Course.  Risk Prescription drug  management. Decision regarding hospitalization.         Consultants: I consulted with Hospitalist, Dr. Joneen Roach, who is appreciated for admitting.   Treatment and Plan: Patient's exam and diagnostic results are concerning for AMS thought 2/2 UTI with associated generalized weaknes.  Feel that patient will need admission to the hospital for further treatment and evaluation.    Final Clinical Impressions(s) / ED Diagnoses     ICD-10-CM   1. Altered mental status, unspecified altered mental status type  R41.82     2. Acute cystitis without hematuria  N30.00       ED Discharge Orders     None         Discharge Instructions Discussed with and Provided to Patient:   Discharge Instructions   None      Roxy Horseman, PA-C 02/08/24  [id number]    Glynn Octave, MD 02/08/24 [phone]

## 2024-02-07 NOTE — ED Triage Notes (Signed)
 The pt reports that his memory is not  normal although the pts son is doing the talking  he's nauseated but no vomiting  [facility]  he was told to come for admission he has had these symptoms intermittently since january

## 2024-02-07 NOTE — ED Provider Triage Note (Signed)
 Emergency Medicine Provider Triage Evaluation Note  Arthur Cook , a 78 y.o. male  was evaluated in triage.  Pt complains of gen weakness, nausea, and confusion/memory issues. Has been going on for awhile/last several weeks - family member reports has been treated for possible uti x 3, but cultures come back neg. No fevers. No trauma/fall. Nausea, no vomiting.   Review of Systems  Positive: Weakness/general.  Negative: Fever, chest pain, sob  Physical Exam  BP 120/77   Pulse 92   Temp 97.8 F (36.6 C)   Resp 18   Ht 1.778 m (5\' 10" )   Wt 101.3 kg   SpO2 99%   BMI 32.04 kg/m  Gen:   Awake, no distress   Resp:  Normal effort cta MSK:   Moves extremities without difficulty no edema.  Abd soft nt.  Neuro, alert, speech clear, motor/sens grossly intact.   Medical Decision Making  Medically screening exam initiated at 5:18 PM.  Appropriate orders placed.  VIPUL CAFARELLI was informed that the remainder of the evaluation will be completed by another provider, this initial triage assessment does not replace that evaluation, and the importance of remaining in the ED until their evaluation is complete.  Labs and imaging ordered.    Cathren Laine, MD 02/07/24 [phone]

## 2024-02-08 DIAGNOSIS — R4182 Altered mental status, unspecified: Secondary | ICD-10-CM | POA: Diagnosis present

## 2024-02-08 LAB — CBG MONITORING, ED: Glucose-Capillary: 190 mg/dL — ABNORMAL HIGH (ref 70–99)

## 2024-02-08 LAB — RPR
RPR Ser Ql: REACTIVE — AB
RPR Titer: 1:1 {titer}

## 2024-02-08 LAB — HEMOGLOBIN A1C
Hgb A1c MFr Bld: 8.4 % — ABNORMAL HIGH (ref 4.8–5.6)
Mean Plasma Glucose: 194.38 mg/dL

## 2024-02-08 LAB — BASIC METABOLIC PANEL
Anion gap: 12 (ref 5–15)
BUN: 20 mg/dL (ref 8–23)
CO2: 19 mmol/L — ABNORMAL LOW (ref 22–32)
Calcium: 8.9 mg/dL (ref 8.9–10.3)
Chloride: 105 mmol/L (ref 98–111)
Creatinine, Ser: 1.11 mg/dL (ref 0.61–1.24)
GFR, Estimated: >60 mL/min (ref >60)
Glucose, Bld: 192 mg/dL — ABNORMAL HIGH (ref 70–99)
Potassium: 3.7 mmol/L (ref 3.5–5.1)
Sodium: 136 mmol/L (ref 135–145)

## 2024-02-08 LAB — CBC WITH DIFFERENTIAL/PLATELET
Abs Immature Granulocytes: 0.03 10*3/uL (ref 0.00–0.07)
Basophils Absolute: 0.1 10*3/uL (ref 0.0–0.1)
Basophils Relative: 1 %
Eosinophils Absolute: 0.3 10*3/uL (ref 0.0–0.5)
Eosinophils Relative: 3 %
HCT: 36.6 % — ABNORMAL LOW (ref 39.0–52.0)
Hemoglobin: 11.3 g/dL — ABNORMAL LOW (ref 13.0–17.0)
Immature Granulocytes: 0 %
Lymphocytes Relative: 25 %
Lymphs Abs: 2.6 10*3/uL (ref 0.7–4.0)
MCH: 26 pg (ref 26.0–34.0)
MCHC: 30.9 g/dL (ref 30.0–36.0)
MCV: 84.1 fL (ref 80.0–100.0)
Monocytes Absolute: 0.8 10*3/uL (ref 0.1–1.0)
Monocytes Relative: 8 %
Neutro Abs: 6.5 10*3/uL (ref 1.7–7.7)
Neutrophils Relative %: 63 %
Platelets: 336 10*3/uL (ref 150–400)
RBC: 4.35 MIL/uL (ref 4.22–5.81)
RDW: 14.6 % (ref 11.5–15.5)
WBC: 10.4 10*3/uL (ref 4.0–10.5)
nRBC: 0 % (ref 0.0–0.2)

## 2024-02-08 LAB — GLUCOSE, CAPILLARY
Glucose-Capillary: 199 mg/dL — ABNORMAL HIGH (ref 70–99)
Glucose-Capillary: 188 mg/dL — ABNORMAL HIGH (ref 70–99)
Glucose-Capillary: 168 mg/dL — ABNORMAL HIGH (ref 70–99)
Glucose-Capillary: 144 mg/dL — ABNORMAL HIGH (ref 70–99)

## 2024-02-08 LAB — VITAMIN B12: Vitamin B-12: 286 pg/mL (ref 180–914)

## 2024-02-08 MED ORDER — QUETIAPINE FUMARATE 100 MG PO TABS
100.0000 mg | ORAL_TABLET | Freq: Every day | ORAL | Status: DC
  Administered 2024-02-08: 100 mg via ORAL
  Filled 2024-02-08: qty 1

## 2024-02-08 MED ORDER — HYDROCODONE-ACETAMINOPHEN 10-325 MG PO TABS
1.0000 | ORAL_TABLET | Freq: Three times a day (TID) | ORAL | Status: DC | PRN
  Administered 2024-02-08 – 2024-02-09 (×2): 1 via ORAL
  Filled 2024-02-08 (×2): qty 1

## 2024-02-08 MED ORDER — SENNOSIDES-DOCUSATE SODIUM 8.6-50 MG PO TABS: 1.0000 | ORAL_TABLET | Freq: Every evening | ORAL | Status: DC | PRN

## 2024-02-08 MED ORDER — ASPIRIN 81 MG PO CHEW
81.0000 mg | CHEWABLE_TABLET | Freq: Every day | ORAL | Status: DC
  Administered 2024-02-08 – 2024-02-09 (×2): 81 mg via ORAL
  Filled 2024-02-08 (×2): qty 1

## 2024-02-08 MED ORDER — ATORVASTATIN CALCIUM 10 MG PO TABS
20.0000 mg | ORAL_TABLET | Freq: Every day | ORAL | Status: DC
  Administered 2024-02-08: 20 mg via ORAL
  Filled 2024-02-08: qty 2

## 2024-02-08 MED ORDER — INSULIN ASPART 100 UNIT/ML IJ SOLN: 0.0000 [IU] | Freq: Every day | INTRAMUSCULAR | Status: DC

## 2024-02-08 MED ORDER — ACETAMINOPHEN 325 MG PO TABS
650.0000 mg | ORAL_TABLET | Freq: Four times a day (QID) | ORAL | Status: DC | PRN
  Administered 2024-02-08 (×2): 650 mg via ORAL
  Filled 2024-02-08 (×2): qty 2

## 2024-02-08 MED ORDER — INSULIN ASPART 100 UNIT/ML IJ SOLN
0.0000 [IU] | Freq: Three times a day (TID) | INTRAMUSCULAR | Status: DC
  Administered 2024-02-08 – 2024-02-09 (×4): 3 [IU] via SUBCUTANEOUS

## 2024-02-08 MED ORDER — GABAPENTIN 100 MG PO CAPS
200.0000 mg | ORAL_CAPSULE | Freq: Every day | ORAL | Status: DC
  Administered 2024-02-08: 200 mg via ORAL
  Filled 2024-02-08: qty 2

## 2024-02-08 MED ORDER — FINASTERIDE 5 MG PO TABS
5.0000 mg | ORAL_TABLET | Freq: Every day | ORAL | Status: DC
  Administered 2024-02-08 – 2024-02-09 (×2): 5 mg via ORAL
  Filled 2024-02-08 (×2): qty 1

## 2024-02-08 MED ORDER — METOCLOPRAMIDE HCL 10 MG PO TABS
5.0000 mg | ORAL_TABLET | Freq: Three times a day (TID) | ORAL | Status: DC
  Administered 2024-02-08 (×2): 5 mg via ORAL
  Filled 2024-02-08: qty 1
  Filled 2024-02-08: qty 0.5
  Filled 2024-02-08 (×2): qty 1
  Filled 2024-02-08: qty 0.5
  Filled 2024-02-08: qty 1
  Filled 2024-02-08 (×2): qty 0.5

## 2024-02-08 MED ORDER — HEPARIN SODIUM (PORCINE) 5000 UNIT/ML IJ SOLN
5000.0000 [IU] | Freq: Three times a day (TID) | INTRAMUSCULAR | Status: DC
  Administered 2024-02-08 – 2024-02-09 (×4): 5000 [IU] via SUBCUTANEOUS
  Filled 2024-02-08 (×4): qty 1

## 2024-02-08 MED ORDER — HALOPERIDOL LACTATE 5 MG/ML IJ SOLN
5.0000 mg | Freq: Once | INTRAMUSCULAR | Status: AC
  Administered 2024-02-08: 5 mg via INTRAVENOUS
  Filled 2024-02-08: qty 1

## 2024-02-08 MED ORDER — TAMSULOSIN HCL 0.4 MG PO CAPS
0.8000 mg | ORAL_CAPSULE | Freq: Every day | ORAL | Status: DC
  Administered 2024-02-08: 0.8 mg via ORAL
  Filled 2024-02-08: qty 2

## 2024-02-08 MED ORDER — PANTOPRAZOLE SODIUM 40 MG PO TBEC
40.0000 mg | DELAYED_RELEASE_TABLET | Freq: Two times a day (BID) | ORAL | Status: DC
  Administered 2024-02-08 – 2024-02-09 (×3): 40 mg via ORAL
  Filled 2024-02-08 (×3): qty 1

## 2024-02-08 MED ORDER — ACETAMINOPHEN 650 MG RE SUPP: 650.0000 mg | Freq: Four times a day (QID) | RECTAL | Status: DC | PRN

## 2024-02-08 NOTE — Plan of Care (Signed)
  Problem: Education: Goal: Ability to describe self-care measures that may prevent or decrease complications (Diabetes Survival Skills Education) will improve Outcome: Progressing Goal: Individualized Educational Video(s) Outcome: Progressing   Problem: Coping: Goal: Ability to adjust to condition or change in health will improve Outcome: Progressing   Problem: Fluid Volume: Goal: Ability to maintain a balanced intake and output will improve Outcome: Progressing   Problem: Health Behavior/Discharge Planning: Goal: Ability to identify and utilize available resources and services will improve Outcome: Progressing Goal: Ability to manage health-related needs will improve Outcome: Progressing   Problem: Nutritional: Goal: Maintenance of adequate nutrition will improve Outcome: Progressing Goal: Progress toward achieving an optimal weight will improve Outcome: Progressing   

## 2024-02-08 NOTE — Progress Notes (Addendum)
 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  Patient presented to the ER with complaints of the patient being confused, not feeling well the last 3 days.  He had been taken to [facility] clinic and was referred to the ER for possible UTI treatment.  Per report the patient has been having recurrent UTIs since last year.  In December he had a cystoscopy that was unrevealing of a cause.  They follow with urology, repeat cystoscopy on the discussion.  The patient has been treated 3 times recently for UTI.  Twice with Bactrim, 1 with Cipro.  Urine cultures on 3 times of come back negative for bacterial growth.  Per family, patient sleeps all day, does awaken at night, continuously urinating.  He also has nausea and vomiting, leading to poorer p.o. intake.  That is reason why they believe he is sick.  And brought him to the ER. Since admission, his confusion has somewhat improved.     Assessment & Plan: Altered mental status //dementia -Asking to go home.  He is oriented to person, year, place, president.  However he does not know why he is in the hospital and did not remember being brought to the hospital. -Working diagnosis as far as the concern for UTI.  Culture is pending.  He is status post 1 dose of Rocephin in the emergency room. -Admitting physician held off on further antibiotics and is awaiting culture.  I agree with this plan especially as he continues to appear improved today, at least from admission. -Question is whether this is an acute issue or simply progression of his underlying dementia.  Will need to further discuss with family. -Will rule out UTI but greater suspicion worsening mentation is due to worsening dementia.  Patient is up all night, sleeps all day, this is not  unusual in patients with dementia, often contributing to caretaker burnout. -Patient sundown's.  Seroquel reordered   Nausea and vomiting -Resolved today. -Question about possible gastroparesis.  He is now on Reglan 3 times daily. -Can continue in house but would hold long-term use of this with patient's comorbidities and age. -Holding metformin.   UTI -See above.   - That said, patient with persistent Hgb in urine.  Concern would be for bladder cancer in this patient on longstanding Actos, which is a known -- although less common -- contributor to bladder cancer.  - recommend cystoscopy outpatient    T2DM // peripheral neuropathy -Adding scale insulin ordered -Neurontin resumed on admission. -Concern for gastroparesis on admission.  Which would have led to vomiting.   HLD -Atorvastatin   BPH -Proscar resumed   GERD //  -Protonix twice daily ordered         DVT prophylaxis:  heparin injection 5,000 Units Start: 02/08/24 1400  Code Status:   Code Status: Full Code Level of care: Med-Surg Status is: Observation   Subjective: Patient is awake and alert.  Asking to go home.  Does not remember why he was brought in to the hospital.  No complaints except for food.  Objective: Vitals:   02/08/24 0744 02/08/24 0813 02/08/24 1212 02/08/24 1613  BP: (!) 140/87 (!) 150/76 129/60 91/74  Pulse: 84 87 66 84  Resp: 19 18 18 20   Temp: 97.8 F (36.6 C) 97.7 F (36.5 C)  97.7 F (36.5  C)  TempSrc: Oral Oral  Oral  SpO2: 99% 99% 97% 97%  Weight:      Height:        Intake/Output Summary (Last 24 hours) at 02/08/2024 1743 Last data filed at 02/08/2024 1700 Gross per 24 hour  Intake 1340 ml  Output 200 ml  Net 1140 ml   Filed Weights   02/07/24 1654  Weight: 101.3 kg   Body mass index is 32.04 kg/m.  Gen: 78 y.o. male in no apparent distress.  Nontoxic Pulm: Non-labored breathing.  Clear to auscultation bilaterally.  CV: Regular rate and rhythm. No murmur, rub, or  gallop. No JVD GI: Abdomen soft, non-tender, non-distended, with normoactive bowel sounds. No organomegaly or masses felt. Ext: Warm, no deformities, no pedal edema Skin: No rashes, lesions  Neuro: Alert and oriented to person, place, year, but not reason he is in the hospital. No focal neurological deficits. Psych: Calm     I have personally reviewed the following labs and images: CBC: Recent Labs  Lab 02/07/24 1720 02/08/24 0517  WBC 14.7* 10.4  NEUTROABS  --  6.5  HGB 12.1* 11.3*  HCT 38.7* 36.6*  MCV 84.9 84.1  PLT 403* 336   BMP &GFR Recent Labs  Lab 02/07/24 1720 02/08/24 0517  NA 135 136  K 3.2* 3.7  CL 100 105  CO2 23 19*  GLUCOSE 215* 192*  BUN 19 20  CREATININE 1.24 1.11  CALCIUM 9.4 8.9   Estimated Creatinine Clearance: 64.3 mL/min (by C-G formula based on SCr of 1.11 mg/dL). Liver & Pancreas: Recent Labs  Lab 02/07/24 1720  AST 55*  ALT 21  ALKPHOS 54  BILITOT 1.0  PROT 8.0  ALBUMIN 4.1   No results for input(s): "LIPASE", "AMYLASE" in the last 168 hours. No results for input(s): "AMMONIA" in the last 168 hours. Diabetic: Recent Labs    02/08/24 0840  HGBA1C 8.4*   Recent Labs  Lab 02/08/24 0203 02/08/24 0829 02/08/24 1209 02/08/24 1609  GLUCAP 190* 199* 188* 168*   Cardiac Enzymes: No results for input(s): "CKTOTAL", "CKMB", "CKMBINDEX", "TROPONINI" in the last 168 hours. No results for input(s): "PROBNP" in the last 8760 hours. Coagulation Profile: No results for input(s): "INR", "PROTIME" in the last 168 hours. Thyroid Function Tests: Recent Labs    02/07/24 1723  TSH 0.932   Lipid Profile: No results for input(s): "CHOL", "HDL", "LDLCALC", "TRIG", "CHOLHDL", "LDLDIRECT" in the last 72 hours. Anemia Panel: Recent Labs    02/08/24 0517  VITAMINB12 286   Urine analysis:    Component Value Date/Time   COLORURINE YELLOW 02/07/2024 1720   APPEARANCEUR HAZY (A) 02/07/2024 1720   LABSPEC >1.030 (H) 02/07/2024 1720    PHURINE 5.0 02/07/2024 1720   GLUCOSEU NEGATIVE 02/07/2024 1720   HGBUR MODERATE (A) 02/07/2024 1720   BILIRUBINUR SMALL (A) 02/07/2024 1720   KETONESUR NEGATIVE 02/07/2024 1720   PROTEINUR >300 (A) 02/07/2024 1720   NITRITE NEGATIVE 02/07/2024 1720   LEUKOCYTESUR SMALL (A) 02/07/2024 1720   Sepsis Labs: Invalid input(s): "PROCALCITONIN", "LACTICIDVEN"  Microbiology: No results found for this or any previous visit (from the past 240 hours).  Radiology Studies: CT ABDOMEN PELVIS W CONTRAST Result Date: 02/07/2024 CLINICAL DATA:  Abdominal pain EXAM: CT ABDOMEN AND PELVIS WITH CONTRAST TECHNIQUE: Multidetector CT imaging of the abdomen and pelvis was performed using the standard protocol following bolus administration of intravenous contrast. RADIATION DOSE REDUCTION: This exam was performed according to the departmental dose-optimization program which includes automated exposure  control, adjustment of the mA and/or kV according to patient size and/or use of iterative reconstruction technique. CONTRAST:  75mL OMNIPAQUE IOHEXOL 350 MG/ML SOLN COMPARISON:  10/26/2017 FINDINGS: Lower chest: No acute abnormality. Hepatobiliary: Fatty infiltration of the liver is noted. The gallbladder is within normal limits. Pancreas: Unremarkable. No pancreatic ductal dilatation or surrounding inflammatory changes. Spleen: Normal in size without focal abnormality. Adrenals/Urinary Tract: Adrenal glands are stable. 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 No further follow-up is recommended. Stable cystic lesion in the upper pole of the right kidney is noted unchanged from the prior exam of 2018. Given this long-term stability it is felt to be benign in etiology. No further follow-up is recommended. No renal calculi or obstructive changes are seen. Vascular calcifications are noted. The bladder is decompressed. Stomach/Bowel: Scattered diverticular change of the colon is noted  without evidence of diverticulitis. The appendix is within normal limits. Small bowel and stomach are unremarkable. Vascular/Lymphatic: Aortic atherosclerosis. No enlarged abdominal or pelvic lymph nodes. Reproductive: Prostate is unremarkable. Other: No abdominal wall hernia or abnormality. No abdominopelvic ascites. Musculoskeletal: No acute or significant osseous findings. IMPRESSION: Fatty liver. Stable changes in the right kidney and left adrenal gland. No further follow-up is recommended. Diverticulosis without diverticulitis. Electronically Signed   By: Alcide Clever M.D.   On: 02/07/2024 23:08   CT Head Wo Contrast Result Date: 02/07/2024 CLINICAL DATA:  Mental status change EXAM: CT HEAD WITHOUT CONTRAST TECHNIQUE: Contiguous axial images were obtained from the base of the skull through the vertex without intravenous contrast. RADIATION DOSE REDUCTION: This exam was performed according to the departmental dose-optimization program which includes automated exposure control, adjustment of the mA and/or kV according to patient size and/or use of iterative reconstruction technique. COMPARISON:  None Available. FINDINGS: Brain: No acute territorial infarction, hemorrhage or intracranial mass. Moderate atrophy. Minimal chronic small vessel ischemic changes of the white matter. Nonenlarged ventricles Vascular: No hyperdense vessel or unexpected calcification. Skull: Normal. Negative for fracture or focal lesion. Sinuses/Orbits: No acute finding. Other: None IMPRESSION: 1. No CT evidence for acute intracranial abnormality. 2. Moderate atrophy. Minimal chronic small vessel ischemic changes of the white matter. Electronically Signed   By: Jasmine Pang M.D.   On: 02/07/2024 19:41    Scheduled Meds:  aspirin  81 mg Oral Daily   atorvastatin  20 mg Oral QHS   finasteride  5 mg Oral Daily   gabapentin  200 mg Oral QHS   heparin  5,000 Units Subcutaneous Q8H   insulin aspart  0-15 Units Subcutaneous TID WC    insulin aspart  0-5 Units Subcutaneous QHS   metoCLOPramide  5 mg Oral TID AC   pantoprazole  40 mg Oral BID   QUEtiapine  100 mg Oral QHS   tamsulosin  0.8 mg Oral QHS   Continuous Infusions:   LOS: 0 days   35 minutes with more than 50% spent in reviewing records, counseling patient/family and coordinating care.  Tobey Grim, MD [facility] [email or web address] 02/08/2024, 5:43 PM

## 2024-02-09 DIAGNOSIS — R4182 Altered mental status, unspecified: Secondary | ICD-10-CM | POA: Diagnosis not present

## 2024-02-09 DIAGNOSIS — R404 Transient alteration of awareness: Secondary | ICD-10-CM

## 2024-02-09 LAB — CBC
HCT: 33.1 % — ABNORMAL LOW (ref 39.0–52.0)
HCT: 35.3 % — ABNORMAL LOW (ref 39.0–52.0)
Hemoglobin: 10.3 g/dL — ABNORMAL LOW (ref 13.0–17.0)
Hemoglobin: 11.1 g/dL — ABNORMAL LOW (ref 13.0–17.0)
MCH: 26.2 pg (ref 26.0–34.0)
MCH: 26.6 pg (ref 26.0–34.0)
MCHC: 31.1 g/dL (ref 30.0–36.0)
MCHC: 31.4 g/dL (ref 30.0–36.0)
MCV: 84.2 fL (ref 80.0–100.0)
MCV: 84.7 fL (ref 80.0–100.0)
Platelets: 314 10*3/uL (ref 150–400)
Platelets: 331 10*3/uL (ref 150–400)
RBC: 3.93 MIL/uL — ABNORMAL LOW (ref 4.22–5.81)
RBC: 4.17 MIL/uL — ABNORMAL LOW (ref 4.22–5.81)
RDW: 14.4 % (ref 11.5–15.5)
RDW: 14.6 % (ref 11.5–15.5)
WBC: 8 10*3/uL (ref 4.0–10.5)
WBC: 7.5 10*3/uL (ref 4.0–10.5)
nRBC: 0 % (ref 0.0–0.2)
nRBC: 0 % (ref 0.0–0.2)

## 2024-02-09 LAB — URINE CULTURE: Culture: 70000 — AB

## 2024-02-09 LAB — GLUCOSE, CAPILLARY: Glucose-Capillary: 192 mg/dL — ABNORMAL HIGH (ref 70–99)

## 2024-02-09 LAB — BASIC METABOLIC PANEL
Anion gap: 10 (ref 5–15)
BUN: 19 mg/dL (ref 8–23)
CO2: 22 mmol/L (ref 22–32)
Calcium: 9.1 mg/dL (ref 8.9–10.3)
Chloride: 106 mmol/L (ref 98–111)
Creatinine, Ser: 0.94 mg/dL (ref 0.61–1.24)
GFR, Estimated: >60 mL/min (ref >60)
Glucose, Bld: 198 mg/dL — ABNORMAL HIGH (ref 70–99)
Potassium: 3.4 mmol/L — ABNORMAL LOW (ref 3.5–5.1)
Sodium: 138 mmol/L (ref 135–145)

## 2024-02-09 MED ORDER — METOCLOPRAMIDE HCL 5 MG PO TABS: 5.0000 mg | ORAL_TABLET | Freq: Three times a day (TID) | ORAL | Status: DC | PRN

## 2024-02-09 NOTE — Plan of Care (Signed)

## 2024-02-09 NOTE — Care Management Obs Status (Signed)
 MEDICARE OBSERVATION STATUS NOTIFICATION   Patient Details  Name: Arthur Cook MRN: [id number] Date of Birth: [birth year]   Medicare Observation Status Notification Given:  Yes    Isaias Cowman, RN 02/09/2024, 9:45 AM

## 2024-02-09 NOTE — Discharge Summary (Signed)
 Physician Discharge Summary   Patient: Arthur Cook MRN: [id number] DOB: 04/04/1946  Admit date:     02/07/2024  Discharge date: 02/09/24  Discharge Physician: Tobey Grim   PCP: [facility]   Recommendations at discharge:    [facility] to ensure continued resolution of confusion. [facility].   Follow-up with urology previously scheduled appointment due to persistent hematuria and known longstanding pioglitazone use. 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  Reflex confirmatory testing pending at time of DC.  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  Patient presented to the ER with complaints of the patient being confused, not feeling well the last 3 days.  He had been taken to [facility] clinic and was referred to the ER for possible UTI treatment.  Per report the patient has been having recurrent UTIs since last year.  In December he had a cystoscopy that was unrevealing of a cause.  They follow with urology, repeat cystoscopy on the discussion.  Patient brought to the ER due to worsening confusion.  On hospital day 2 he became much less confused and knew where he was and that the year was 2025 but was unable to tell me why he was in the hospital.  Due to persistent improvement and clearing back to his baseline level of cognition patient was discharged home on hospital day 3.  Of note patient was noted to have bruising under his right eye.  He could not remember on hospital day 2 where this came from.  However on hospital day 3 he was able to remember that he had fallen out of his bed and hit his face on the corner of the table on day prior to admission.  Family states that when  they left him on Thursday he had no bruise.  It is most likely he suffered at least a minor concussion after falling out of bed on Friday, leading to worsening confusion and hospitalization..  CT scan of his head was negative.  Again, mentation cleared on day of discharge.     Assessment & Plan: Altered mental status //dementia -Original working diagnosis was UTI.  His urine cultures have been negative and it sounds like they have been negative outpatient as well. -I do believe he had at least a mild concussion after falling out of bed which likely led to some acute confusion. -Progression of his underlying dementia is also likely as well as family reports that he had increased sundowning over the past several weeks.- -Patient should follow-up with primary care provider for further discussion on his dementia.    Positive RPR: -Ordered as part of "dementia workup" on admission. -RPR came back positive but with very weak titer 1: 1. -Discussed with ID on-call Dr. Algis Liming who agreed either history of treated syphilis (patient with no history of this) or false positive. -I do not think there is evidence of neurosyphilis as cause of his dementia, especially in light of how well he cleared by time of discharge to baseline cognition level. -Reflex confirmatory testing for positive RPR pending at time of discharge.  Nausea and vomiting -Reported prior to admission.  I did discuss with family about this more and they believe that he had  the stomach after taking his medications without any food. -Regardless he had no further nausea vomiting after admission to the hospital.   UTI -See above.   - That said, patient with persistent Hgb in urine.  Concern would be for bladder cancer in this patient on longstanding Actos, which is a known -- although less common -- contributor to bladder cancer.  - recommend cystoscopy outpatient    T2DM // peripheral neuropathy -Restarting on home diabetic medications    HLD -Atorvastatin restarted   BPH -Proscar resumed          Disposition: Home Diet recommendation:  Discharge Diet Orders (From admission, onward)     Start     Ordered   02/09/24 0000  Diet - low sodium heart healthy        02/09/24 1537           Carb modified diet DISCHARGE MEDICATION: Allergies as of 02/09/2024       Reactions   Bupropion    Other Reaction(s): Headache        Medication List     STOP taking these medications    nitrofurantoin (macrocrystal-monohydrate) 100 MG capsule Commonly known as: MACROBID       TAKE these medications    Artificial Tears 1.4 % ophthalmic solution Generic drug: polyvinyl alcohol Place 1 drop into both eyes 4 (four) times daily.   ascorbic acid 500 MG tablet Commonly known as: VITAMIN C Take 500 mg by mouth daily. Take daily with iron tablet   aspirin 81 MG chewable tablet Chew 81 mg by mouth daily.   atorvastatin 20 MG tablet Commonly known as: LIPITOR Take 20 mg by mouth at bedtime.   diclofenac Sodium 1 % Gel Commonly known as: VOLTAREN Apply 2 g topically 2 (two) times daily.   empagliflozin 25 MG Tabs tablet Commonly known as: JARDIANCE Take 12.5 mg by mouth daily.   feeding supplement (GLUCERNA SHAKE) Liqd Take 237 mLs by mouth 2 (two) times daily between meals.   ferrous sulfate 325 (65 FE) MG tablet Take by mouth. Take with Vit C tablet   finasteride 5 MG tablet Commonly known as: PROSCAR TAKE ONE TABLET BY MOUTH DAILY FOR PROSTATE   gabapentin 100 MG capsule Commonly known as: NEURONTIN Take 200 mg by mouth at bedtime.   glipiZIDE 5 MG tablet Commonly known as: GLUCOTROL Take 10 mg by mouth daily with supper. Give 30 mins before supper   HYDROcodone-acetaminophen 10-325 MG tablet Commonly known as: NORCO Take 1 tablet by mouth 3 (three) times daily as needed for moderate pain (pain score 4-6).   metFORMIN 1000 MG tablet Commonly known as: GLUCOPHAGE Take 1,000 mg by mouth  2 (two) times daily with a meal.   naloxone 4 MG/0.1ML Liqd nasal spray kit Commonly known as: NARCAN Place into the nose.   pantoprazole 40 MG tablet Commonly known as: PROTONIX Take 1 tablet (40 mg total) by mouth 2 (two) times daily.   pioglitazone 30 MG tablet Commonly known as: ACTOS Take 30 mg by mouth daily.   Polyethyl Glycol-Propyl Glycol 0.4-0.3 % Soln INSTILL 1 DROP IN EACH EYE THREE TIMES A DAY   QUEtiapine 100 MG tablet Commonly known as: SEROQUEL Take 100 mg by mouth at bedtime.   SUMAtriptan 6 MG/0.5ML Soaj Inject 6 mg into the skin as needed (take on onset. May repeat in one hour - no more than 2 injections in 24 hours).   SUMAtriptan 100 MG tablet Commonly known as: IMITREX  Take 100 mg by mouth every 2 (two) hours as needed for headache.   tamsulosin 0.4 MG Caps capsule Commonly known as: FLOMAX Take 0.8 mg by mouth at bedtime.        Discharge Exam: Filed Weights   02/07/24 1654  Weight: 101.3 kg   Gen: 78 y.o. male in no apparent distress.  Nontoxic HEENT:  With healing bruise right zygomatic arch.  No stepoff here Pulm: Non-labored breathing.  Clear to auscultation bilaterally.  CV: Regular rate and rhythm. No murmur, rub, or gallop. No JVD GI: Abdomen soft, non-tender, non-distended, with normoactive bowel sounds. No organomegaly or masses felt. Ext: Warm, no deformities, no pedal edema Skin: No rashes, lesions  Neuro: Alert and oriented to person, place, year, and why he is in the hospital.  No focal neurological deficits. Psych: Calm   Condition at discharge: good  The results of significant diagnostics from this hospitalization (including imaging, microbiology, ancillary and laboratory) are listed below for reference.   Imaging Studies: CT ABDOMEN PELVIS W CONTRAST Result Date: 02/07/2024 CLINICAL DATA:  Abdominal pain EXAM: CT ABDOMEN AND PELVIS WITH CONTRAST TECHNIQUE: Multidetector CT imaging of the abdomen and pelvis was performed  using the standard protocol following bolus administration of intravenous contrast. RADIATION DOSE REDUCTION: This exam was performed according to the departmental dose-optimization program which includes automated exposure control, adjustment of the mA and/or kV according to patient size and/or use of iterative reconstruction technique. CONTRAST:  75mL OMNIPAQUE IOHEXOL 350 MG/ML SOLN COMPARISON:  10/26/2017 FINDINGS: Lower chest: No acute abnormality. Hepatobiliary: Fatty infiltration of the liver is noted. The gallbladder is within normal limits. Pancreas: Unremarkable. No pancreatic ductal dilatation or surrounding inflammatory changes. Spleen: Normal in size without focal abnormality. Adrenals/Urinary Tract: Adrenal glands are stable. 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 No further follow-up is recommended. Stable cystic lesion in the upper pole of the right kidney is noted unchanged from the prior exam of 2018. Given this long-term stability it is felt to be benign in etiology. No further follow-up is recommended. No renal calculi or obstructive changes are seen. Vascular calcifications are noted. The bladder is decompressed. Stomach/Bowel: Scattered diverticular change of the colon is noted without evidence of diverticulitis. The appendix is within normal limits. Small bowel and stomach are unremarkable. Vascular/Lymphatic: Aortic atherosclerosis. No enlarged abdominal or pelvic lymph nodes. Reproductive: Prostate is unremarkable. Other: No abdominal wall hernia or abnormality. No abdominopelvic ascites. Musculoskeletal: No acute or significant osseous findings. IMPRESSION: Fatty liver. Stable changes in the right kidney and left adrenal gland. No further follow-up is recommended. Diverticulosis without diverticulitis. Electronically Signed   By: Alcide Clever M.D.   On: 02/07/2024 23:08   CT Head Wo Contrast Result Date: 02/07/2024 CLINICAL DATA:  Mental status  change EXAM: CT HEAD WITHOUT CONTRAST TECHNIQUE: Contiguous axial images were obtained from the base of the skull through the vertex without intravenous contrast. RADIATION DOSE REDUCTION: This exam was performed according to the departmental dose-optimization program which includes automated exposure control, adjustment of the mA and/or kV according to patient size and/or use of iterative reconstruction technique. COMPARISON:  None Available. FINDINGS: Brain: No acute territorial infarction, hemorrhage or intracranial mass. Moderate atrophy. Minimal chronic small vessel ischemic changes of the white matter. Nonenlarged ventricles Vascular: No hyperdense vessel or unexpected calcification. Skull: Normal. Negative for fracture or focal lesion. Sinuses/Orbits: No acute finding. Other: None IMPRESSION: 1. No CT evidence for acute intracranial abnormality. 2. Moderate atrophy. Minimal  chronic small vessel ischemic changes of the white matter. Electronically Signed   By: Jasmine Pang M.D.   On: 02/07/2024 19:41    Microbiology: Results for orders placed or performed during the hospital encounter of 02/07/24  Urine Culture     Status: Abnormal   Collection Time: 02/07/24  9:37 PM   Specimen: Urine, Clean Catch  Result Value Ref Range Status   Specimen Description URINE, CLEAN CATCH  Final   Special Requests   Final    NONE Performed at [facility], [address], [place], [place] [address]    Culture 70,000 COLONIES/mL YEAST (A)  Final   Report Status 02/09/2024 FINAL  Final    Labs: CBC: Recent Labs  Lab 02/07/24 1720 02/08/24 0517 02/09/24 0731 02/09/24 1336  WBC 14.7* 10.4 8.0 7.5  NEUTROABS  --  6.5  --   --   HGB 12.1* 11.3* 10.3* 11.1*  HCT 38.7* 36.6* 33.1* 35.3*  MCV 84.9 84.1 84.2 84.7  PLT 403* 336 314 331   Basic Metabolic Panel: Recent Labs  Lab 02/07/24 1720 02/08/24 0517 02/09/24 0731  NA 135 136 138  K 3.2* 3.7 3.4*  CL 100 105 106  CO2 23 19* 22  GLUCOSE  215* 192* 198*  BUN 19 20 19   CREATININE 1.24 1.11 0.94  CALCIUM 9.4 8.9 9.1   Liver Function Tests: Recent Labs  Lab 02/07/24 1720  AST 55*  ALT 21  ALKPHOS 54  BILITOT 1.0  PROT 8.0  ALBUMIN 4.1   CBG: Recent Labs  Lab 02/08/24 0829 02/08/24 1209 02/08/24 1609 02/08/24 1956 02/09/24 1239  GLUCAP 199* 188* 168* 144* 192*    Discharge time spent: less than 30 minutes.  Signed: Tobey Grim, MD [facility] 02/09/2024

## 2024-02-09 NOTE — Plan of Care (Signed)

## 2024-02-10 LAB — T.PALLIDUM AB, TOTAL: T Pallidum Abs: NONREACTIVE

## 2024-06-19 ENCOUNTER — Encounter: Payer: Self-pay | Admitting: Neurology

## 2024-06-23 ENCOUNTER — Other Ambulatory Visit (HOSPITAL_COMMUNITY): Payer: Self-pay | Admitting: Neurology

## 2024-06-23 DIAGNOSIS — G3184 Mild cognitive impairment, so stated: Secondary | ICD-10-CM

## 2024-06-30 ENCOUNTER — Encounter (HOSPITAL_COMMUNITY)
Admission: RE | Admit: 2024-06-30 | Discharge: 2024-06-30 | Disposition: A | Discharge status: Home / Self Care | Source: Ambulatory Visit | Attending: Neurology | Admitting: Neurology

## 2024-06-30 DIAGNOSIS — G3184 Mild cognitive impairment, so stated: Secondary | ICD-10-CM | POA: Insufficient documentation

## 2024-06-30 LAB — GLUCOSE, CAPILLARY: Glucose-Capillary: 131 mg/dL — ABNORMAL HIGH (ref 70–99)

## 2024-06-30 MED ORDER — FLORBETAPIR F 18 500-1900 MBQ/ML IV SOLN
8.3600 | Freq: Once | INTRAVENOUS | Status: AC
  Administered 2024-06-30 (×2): 8.36 via INTRAVENOUS

## 2024-07-24 ENCOUNTER — Other Ambulatory Visit (INDEPENDENT_AMBULATORY_CARE_PROVIDER_SITE_OTHER): Payer: Self-pay

## 2024-07-24 ENCOUNTER — Encounter: Payer: Self-pay | Admitting: Orthopaedic Surgery

## 2024-07-24 ENCOUNTER — Ambulatory Visit (INDEPENDENT_AMBULATORY_CARE_PROVIDER_SITE_OTHER): Admitting: Orthopaedic Surgery

## 2024-07-24 DIAGNOSIS — G8929 Other chronic pain: Secondary | ICD-10-CM | POA: Diagnosis not present

## 2024-07-24 DIAGNOSIS — M25562 Pain in left knee: Secondary | ICD-10-CM | POA: Diagnosis not present

## 2024-07-24 NOTE — Progress Notes (Signed)
 Office Visit Note   Patient: Arthur Cook.           Date of Birth: [birth year]           MRN: [id number] Visit Date: 07/24/2024              Requested by: Cesario Friend, MD [address] [place],  [place] [address] PCP: [facility]   Assessment & Plan: Visit Diagnoses:  1. Chronic pain of left knee     Plan: History of Present Illness Stephen Baruch. is a 78 year old male with bilateral knee replacements who presents with left knee swelling. He is accompanied by his daughter.  He has experienced swelling in his left knee for approximately two weeks, which has been improving. There is no associated pain. He has a history of bilateral knee replacements, with the left knee replaced in 2001. He denies any instability or fluid accumulation in the knee. The swelling has decreased since it first occurred, and he does not recall any specific incident that might have triggered it.  Physical Exam MUSCULOSKELETAL: No signs of infection in the left knee. Good extension of the knee with fully healed scar.  There is approximately 5 mm of varus valgus instability of the left knee.  Results RADIOLOGY Knee X-ray: Metal implants intact, decreased joint space between metal components indicating wear of polyethylene insert.  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  No loosening of the components.  No infection or acute complications. - Monitor symptoms and knee stability. - Consider polyethylene insert replacement if instability or persistent swelling develops.  Follow-Up Instructions: No follow-ups on file.   Orders:  Orders Placed This Encounter  Procedures   XR KNEE 3 VIEW LEFT   No orders of the defined types were placed in this encounter.     Procedures: No procedures performed   Clinical Data: No additional  findings.   Subjective: Chief Complaint  Patient presents with   Left Knee - Pain    HPI  Review of Systems  Constitutional: Negative.   HENT: Negative.    Eyes: Negative.   Respiratory: Negative.    Cardiovascular: Negative.   Gastrointestinal: Negative.   Endocrine: Negative.   Genitourinary: Negative.   Skin: Negative.   Allergic/Immunologic: Negative.   Neurological: Negative.   Hematological: Negative.   Psychiatric/Behavioral: Negative.    All other systems reviewed and are negative.    Objective: Vital Signs: There were no vitals taken for this visit.  Physical Exam Vitals and nursing note reviewed.  Constitutional:      Appearance: He is well-developed.  HENT:     Head: Normocephalic and atraumatic.  Eyes:     Pupils: Pupils are equal, round, and reactive to light.  Pulmonary:     Effort: Pulmonary effort is normal.  Abdominal:     Palpations: Abdomen is soft.  Musculoskeletal:        General: Normal range of motion.     Cervical back: Neck supple.  Skin:    General: Skin is warm.  Neurological:     Mental Status: He is alert and oriented to person, place, and time.  Psychiatric:        Behavior: Behavior normal.        Thought Content: Thought content normal.        Judgment: Judgment normal.     Ortho Exam  Specialty Comments:  No specialty comments available.  Imaging: XR KNEE 3 VIEW LEFT Result Date: 07/24/2024 X-rays of the left knee show a left total knee arthroplasty without any loosening or subsidence.  There is decreased space between the components which could suggest polyethylene wear.    PMFS History: Patient Active Problem List   Diagnosis Date Noted   Altered mental state 02/08/2024   Altered mental status 02/07/2024   History of stomach ulcers 10/27/2017   Gastric outlet obstruction 10/27/2017   Coffee ground emesis 10/27/2017   Hypertension 10/27/2017   Kidney disease 10/27/2017   Lesion of right native kidney  10/27/2017   Insomnia    Incidental lung nodule    Past Medical History:  Diagnosis Date   History of stomach ulcers    Hypertension    Renal disorder    frequent UTIs    Family History  Problem Relation Age of Onset   Obesity Daughter     Past Surgical History:  Procedure Laterality Date   ESOPHAGOGASTRODUODENOSCOPY (EGD) WITH PROPOFOL  N/A 10/29/2017   Procedure: ESOPHAGOGASTRODUODENOSCOPY (EGD) WITH PROPOFOL ;  Surgeon: Elicia Claw, MD;  Location: [facility];  Service: Gastroenterology;  Laterality: N/A;   knee operations     Social History   Occupational History   Not on file  Tobacco Use   Smoking status: Former    Types: Cigarettes   Smokeless tobacco: Never  Vaping Use   Vaping status: Never Used  Substance and Sexual Activity   Alcohol  use: No   Drug use: No   Sexual activity: Not on file

## 2024-09-21 ENCOUNTER — Encounter: Payer: Self-pay | Admitting: Radiology
